# Patient Record
Sex: Female | Born: 1980 | Race: White | Hispanic: No | Marital: Married | Smoking: Current some day smoker
Health system: Southern US, Community
[De-identification: ages and names within clinical notes are randomized; demographics above are authoritative.]

## PROBLEM LIST (undated history)

## (undated) DIAGNOSIS — F419 Anxiety disorder, unspecified: Secondary | ICD-10-CM

## (undated) DIAGNOSIS — I10 Essential (primary) hypertension: Secondary | ICD-10-CM

## (undated) DIAGNOSIS — N83209 Unspecified ovarian cyst, unspecified side: Secondary | ICD-10-CM

## (undated) DIAGNOSIS — K219 Gastro-esophageal reflux disease without esophagitis: Secondary | ICD-10-CM

## (undated) DIAGNOSIS — IMO0001 Reserved for inherently not codable concepts without codable children: Secondary | ICD-10-CM

## (undated) DIAGNOSIS — R112 Nausea with vomiting, unspecified: Secondary | ICD-10-CM

## (undated) DIAGNOSIS — Z9889 Other specified postprocedural states: Secondary | ICD-10-CM

## (undated) DIAGNOSIS — D649 Anemia, unspecified: Secondary | ICD-10-CM

## (undated) DIAGNOSIS — R102 Pelvic and perineal pain: Secondary | ICD-10-CM

## (undated) DIAGNOSIS — R011 Cardiac murmur, unspecified: Secondary | ICD-10-CM

## (undated) HISTORY — PX: BLADDER SURGERY: SHX569

## (undated) HISTORY — DX: Unspecified ovarian cyst, unspecified side: N83.209

## (undated) HISTORY — PX: WISDOM TOOTH EXTRACTION: SHX21

## (undated) HISTORY — DX: Pelvic and perineal pain: R10.2

## (undated) HISTORY — PX: TUBAL LIGATION: SHX77

---
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
## 2014-04-19 ENCOUNTER — Emergency Department (HOSPITAL_COMMUNITY)
Admission: EM | Admit: 2014-04-19 | Discharge: 2014-04-19 | Disposition: A | Discharge status: Home / Self Care | Payer: Medicaid Other | Attending: Emergency Medicine | Admitting: Emergency Medicine

## 2014-04-19 ENCOUNTER — Emergency Department (HOSPITAL_COMMUNITY): Payer: Medicaid Other

## 2014-04-19 ENCOUNTER — Encounter (HOSPITAL_COMMUNITY): Payer: Self-pay | Admitting: Emergency Medicine

## 2014-04-19 DIAGNOSIS — I10 Essential (primary) hypertension: Secondary | ICD-10-CM | POA: Diagnosis present

## 2014-04-19 DIAGNOSIS — R110 Nausea: Secondary | ICD-10-CM | POA: Insufficient documentation

## 2014-04-19 DIAGNOSIS — Z720 Tobacco use: Secondary | ICD-10-CM | POA: Insufficient documentation

## 2014-04-19 DIAGNOSIS — N939 Abnormal uterine and vaginal bleeding, unspecified: Secondary | ICD-10-CM | POA: Diagnosis not present

## 2014-04-19 DIAGNOSIS — Z9889 Other specified postprocedural states: Secondary | ICD-10-CM | POA: Diagnosis not present

## 2014-04-19 DIAGNOSIS — F419 Anxiety disorder, unspecified: Secondary | ICD-10-CM | POA: Insufficient documentation

## 2014-04-19 DIAGNOSIS — K219 Gastro-esophageal reflux disease without esophagitis: Secondary | ICD-10-CM | POA: Diagnosis not present

## 2014-04-19 DIAGNOSIS — Z3202 Encounter for pregnancy test, result negative: Secondary | ICD-10-CM | POA: Diagnosis not present

## 2014-04-19 DIAGNOSIS — N8320 Unspecified ovarian cysts: Secondary | ICD-10-CM | POA: Diagnosis not present

## 2014-04-19 DIAGNOSIS — R030 Elevated blood-pressure reading, without diagnosis of hypertension: Secondary | ICD-10-CM

## 2014-04-19 DIAGNOSIS — Z79899 Other long term (current) drug therapy: Secondary | ICD-10-CM | POA: Diagnosis not present

## 2014-04-19 DIAGNOSIS — R1031 Right lower quadrant pain: Secondary | ICD-10-CM | POA: Diagnosis not present

## 2014-04-19 DIAGNOSIS — N83201 Unspecified ovarian cyst, right side: Secondary | ICD-10-CM

## 2014-04-19 DIAGNOSIS — R102 Pelvic and perineal pain: Secondary | ICD-10-CM

## 2014-04-19 DIAGNOSIS — IMO0001 Reserved for inherently not codable concepts without codable children: Secondary | ICD-10-CM

## 2014-04-19 HISTORY — DX: Gastro-esophageal reflux disease without esophagitis: K21.9

## 2014-04-19 HISTORY — DX: Essential (primary) hypertension: I10

## 2014-04-19 HISTORY — DX: Anxiety disorder, unspecified: F41.9

## 2014-04-19 HISTORY — DX: Reserved for inherently not codable concepts without codable children: IMO0001

## 2014-04-19 LAB — CBC
HCT: 33.8 % — ABNORMAL LOW (ref 36.0–46.0)
HEMOGLOBIN: 9.9 g/dL — AB (ref 12.0–15.0)
MCH: 19 pg — ABNORMAL LOW (ref 26.0–34.0)
MCHC: 29.3 g/dL — ABNORMAL LOW (ref 30.0–36.0)
MCV: 64.9 fL — ABNORMAL LOW (ref 78.0–100.0)
Platelets: 308 10*3/uL (ref 150–400)
RBC: 5.21 MIL/uL — AB (ref 3.87–5.11)
RDW: 19.7 % — ABNORMAL HIGH (ref 11.5–15.5)
WBC: 9.3 10*3/uL (ref 4.0–10.5)

## 2014-04-19 LAB — COMPREHENSIVE METABOLIC PANEL
ALT: 17 U/L (ref 0–35)
ANION GAP: 15 (ref 5–15)
AST: 24 U/L (ref 0–37)
Albumin: 3.6 g/dL (ref 3.5–5.2)
Alkaline Phosphatase: 83 U/L (ref 39–117)
BUN: 7 mg/dL (ref 6–23)
CHLORIDE: 100 meq/L (ref 96–112)
CO2: 20 mEq/L (ref 19–32)
Calcium: 8.6 mg/dL (ref 8.4–10.5)
Creatinine, Ser: 0.53 mg/dL (ref 0.50–1.10)
GFR calc non Af Amer: >90 mL/min (ref >90)
Glucose, Bld: 85 mg/dL (ref 70–99)
Potassium: 3.8 mEq/L (ref 3.7–5.3)
Sodium: 135 mEq/L — ABNORMAL LOW (ref 137–147)
Total Bilirubin: 0.3 mg/dL (ref 0.3–1.2)
Total Protein: 8 g/dL (ref 6.0–8.3)

## 2014-04-19 LAB — URINALYSIS, ROUTINE W REFLEX MICROSCOPIC
Bilirubin Urine: NEGATIVE
Glucose, UA: NEGATIVE mg/dL
Ketones, ur: NEGATIVE mg/dL
NITRITE: NEGATIVE
PH: 6 (ref 5.0–8.0)
Protein, ur: NEGATIVE mg/dL
SPECIFIC GRAVITY, URINE: 1.018 (ref 1.005–1.030)
Urobilinogen, UA: 1 mg/dL (ref 0.0–1.0)

## 2014-04-19 LAB — URINE MICROSCOPIC-ADD ON

## 2014-04-19 LAB — WET PREP, GENITAL
Clue Cells Wet Prep HPF POC: NONE SEEN
TRICH WET PREP: NONE SEEN
Yeast Wet Prep HPF POC: NONE SEEN

## 2014-04-19 LAB — POC URINE PREG, ED: PREG TEST UR: NEGATIVE

## 2014-04-19 MED ORDER — LABETALOL HCL 100 MG PO TABS: 100.0000 mg | ORAL_TABLET | Freq: Two times a day (BID) | ORAL | Status: DC

## 2014-04-19 MED ORDER — FERROUS SULFATE 325 (65 FE) MG PO TABS: 325.0000 mg | ORAL_TABLET | Freq: Every day | ORAL | Status: DC

## 2014-04-19 MED ORDER — HYDRALAZINE HCL 20 MG/ML IJ SOLN
10.0000 mg | Freq: Once | INTRAMUSCULAR | Status: AC
Start: 2014-04-19 — End: 2014-04-19
  Administered 2014-04-19: 10 mg via INTRAVENOUS
  Filled 2014-04-19: qty 1

## 2014-04-19 MED ORDER — HYDRALAZINE HCL 20 MG/ML IJ SOLN: 5.0000 mg | Freq: Once | INTRAMUSCULAR | Status: DC

## 2014-04-19 MED ORDER — HYDROCODONE-ACETAMINOPHEN 5-325 MG PO TABS: 1.0000 | ORAL_TABLET | ORAL | Status: DC | PRN

## 2014-04-19 NOTE — Discharge Instructions (Signed)
Take Vicodin for severe pain only. No driving or operating heavy machinery while taking vicodin. This medication may cause drowsiness.  Abnormal Uterine Bleeding Abnormal uterine bleeding can affect women at various stages in life, including teenagers, women in their reproductive years, pregnant women, and women who have reached menopause. Several kinds of uterine bleeding are considered abnormal, including:  Bleeding or spotting between periods.   Bleeding after sexual intercourse.   Bleeding that is heavier or more than normal.   Periods that last longer than usual.  Bleeding after menopause.  Many cases of abnormal uterine bleeding are minor and simple to treat, while others are more serious. Any type of abnormal bleeding should be evaluated by your health care provider. Treatment will depend on the cause of the bleeding. HOME CARE INSTRUCTIONS Monitor your condition for any changes. The following actions may help to alleviate any discomfort you are experiencing:  Avoid the use of tampons and douches as directed by your health care provider.  Change your pads frequently. You should get regular pelvic exams and Pap tests. Keep all follow-up appointments for diagnostic tests as directed by your health care provider.  SEEK MEDICAL CARE IF:   Your bleeding lasts more than 1 week.   You feel dizzy at times.  SEEK IMMEDIATE MEDICAL CARE IF:   You pass out.   You are changing pads every 15 to 30 minutes.   You have abdominal pain.  You have a fever.   You become sweaty or weak.   You are passing large blood clots from the vagina.   You start to feel nauseous and vomit. MAKE SURE YOU:   Understand these instructions.  Will watch your condition.  Will get help right away if you are not doing well or get worse. Document Released: 06/27/2005 Document Revised: 07/02/2013 Document Reviewed: 01/24/2013 [facility] Patient Information 2015 [place], [place]. This  information is not intended to replace advice given to you by your health care provider. Make sure you discuss any questions you have with your health care provider.  Ovarian Cyst An ovarian cyst is a fluid-filled sac that forms on an ovary. The ovaries are small organs that produce eggs in women. Various types of cysts can form on the ovaries. Most are not cancerous. Many do not cause problems, and they often go away on their own. Some may cause symptoms and require treatment. Common types of ovarian cysts include:  Functional cysts--These cysts may occur every month during the menstrual cycle. This is normal. The cysts usually go away with the next menstrual cycle if the woman does not get pregnant. Usually, there are no symptoms with a functional cyst.  Endometrioma cysts--These cysts form from the tissue that lines the uterus. They are also called "chocolate cysts" because they become filled with blood that turns brown. This type of cyst can cause pain in the lower abdomen during intercourse and with your menstrual period.  Cystadenoma cysts--This type develops from the cells on the outside of the ovary. These cysts can get very big and cause lower abdomen pain and pain with intercourse. This type of cyst can twist on itself, cut off its blood supply, and cause severe pain. It can also easily rupture and cause a lot of pain.  Dermoid cysts--This type of cyst is sometimes found in both ovaries. These cysts may contain different kinds of body tissue, such as skin, teeth, hair, or cartilage. They usually do not cause symptoms unless they get very big.  Theca lutein  cysts--These cysts occur when too much of a certain hormone (human chorionic gonadotropin) is produced and overstimulates the ovaries to produce an egg. This is most common after procedures used to assist with the conception of a baby (in vitro fertilization). CAUSES   Fertility drugs can cause a condition in which multiple large cysts  are formed on the ovaries. This is called ovarian hyperstimulation syndrome.  A condition called polycystic ovary syndrome can cause hormonal imbalances that can lead to nonfunctional ovarian cysts. SIGNS AND SYMPTOMS  Many ovarian cysts do not cause symptoms. If symptoms are present, they may include:  Pelvic pain or pressure.  Pain in the lower abdomen.  Pain during sexual intercourse.  Increasing girth (swelling) of the abdomen.  Abnormal menstrual periods.  Increasing pain with menstrual periods.  Stopping having menstrual periods without being pregnant. DIAGNOSIS  These cysts are commonly found during a routine or annual pelvic exam. Tests may be ordered to find out more about the cyst. These tests may include:  Ultrasound.  X-ray of the pelvis.  CT scan.  MRI.  Blood tests. TREATMENT  Many ovarian cysts go away on their own without treatment. Your health care provider may want to check your cyst regularly for 2-3 months to see if it changes. For women in menopause, it is particularly important to monitor a cyst closely because of the higher rate of ovarian cancer in menopausal women. When treatment is needed, it may include any of the following:  A procedure to drain the cyst (aspiration). This may be done using a long needle and ultrasound. It can also be done through a laparoscopic procedure. This involves using a thin, lighted tube with a tiny camera on the end (laparoscope) inserted through a small incision.  Surgery to remove the whole cyst. This may be done using laparoscopic surgery or an open surgery involving a larger incision in the lower abdomen.  Hormone treatment or birth control pills. These methods are sometimes used to help dissolve a cyst. HOME CARE INSTRUCTIONS   Only take over-the-counter or prescription medicines as directed by your health care provider.  Follow up with your health care provider as directed.  Get regular pelvic exams and Pap  tests. SEEK MEDICAL CARE IF:   Your periods are late, irregular, or painful, or they stop.  Your pelvic pain or abdominal pain does not go away.  Your abdomen becomes larger or swollen.  You have pressure on your bladder or trouble emptying your bladder completely.  You have pain during sexual intercourse.  You have feelings of fullness, pressure, or discomfort in your stomach.  You lose weight for no apparent reason.  You feel generally ill.  You become constipated.  You lose your appetite.  You develop acne.  You have an increase in body and facial hair.  You are gaining weight, without changing your exercise and eating habits.  You think you are pregnant. SEEK IMMEDIATE MEDICAL CARE IF:   You have increasing abdominal pain.  You feel sick to your stomach (nauseous), and you throw up (vomit).  You develop a fever that comes on suddenly.  You have abdominal pain during a bowel movement.  Your menstrual periods become heavier than usual. MAKE SURE YOU:  Understand these instructions.  Will watch your condition.  Will get help right away if you are not doing well or get worse. Document Released: 06/27/2005 Document Revised: 07/02/2013 Document Reviewed: 03/04/2013 [facility] Patient Information 2015 [place]Care, [place]. This information is not intended to  replace advice given to you by your health care provider. Make sure you discuss any questions you have with your health care provider. ° °

## 2014-04-19 NOTE — ED Provider Notes (Signed)
CSN: [id number]     Arrival date & time 04/19/14  1106 History   First MD Initiated Contact with Patient 04/19/14 1132     Chief Complaint  Patient presents with  . Hypertension     (Consider location/radiation/quality/duration/timing/severity/associated sxs/prior Treatment) HPI Comments: This is a 33 year-old female who presents to the ED with RLQ abdominal pain starting 6 hours ago. Pain described as a dull discomfort. No aggravating or alleviating factors. Patient reports missing her last period and intermittent vaginal spotting that began 2 weeks ago. LMP was February 23, 2014. She took a urine preg test 3 days ago that was a "faint postitive" but took one the next day that she reports was negative. She had a tubal ligation 8 years ago after having a cesarean section. She denies any other vaginal discharge. Admits to nausea without vomiting. She is in a monogamous relationship. She has a history of normal pap smears. She denies any blood in her stool, diarrhea or constipation. She also reports having HTN but being noncompliant with her Lisinopril because it makes her cough, and no longer has a prescription for labetalol. Denies CP, SOB, vision changes, HA.  She does not have  PCP at this time as she recently moved here.  Patient is a 33 y.o. female presenting with hypertension. The history is provided by the patient.  Hypertension Associated symptoms include abdominal pain and nausea.    Past Medical History  Diagnosis Date  . History of C-section   . Hypertension   . Anxiety   . Reflux    Past Surgical History  Procedure Laterality Date  . Bladder surgery     No family history on file. History  Substance Use Topics  . Smoking status: Current Every Day Smoker -- 1.00 packs/day  . Smokeless tobacco: Not on file  . Alcohol Use: No   OB History   Grav Para Term Preterm Abortions TAB SAB Ect Mult Living                 Review of Systems  Gastrointestinal: Positive for nausea  and abdominal pain.  Genitourinary: Positive for vaginal bleeding.  All other systems reviewed and are negative.     Allergies  Dimetapp decongestant  Home Medications   Prior to Admission medications   Medication Sig Start Date End Date Taking? Authorizing Provider  atorvastatin (LIPITOR) 20 MG tablet Take 20 mg by mouth daily.   Yes Historical Provider, MD  fenofibrate 160 MG tablet Take 160 mg by mouth daily.   Yes Historical Provider, MD  omeprazole (PRILOSEC OTC) 20 MG tablet Take 20 mg by mouth daily.   Yes Historical Provider, MD  ranitidine (ZANTAC) 300 MG tablet Take 300 mg by mouth at bedtime.   Yes Historical Provider, MD  sertraline (ZOLOFT) 100 MG tablet Take 150 mg by mouth daily.   Yes Historical Provider, MD  ferrous sulfate 325 (65 FE) MG tablet Take 1 tablet (325 mg total) by mouth daily. 04/19/14   Kathrynn Speedobyn M Senetra Dillin, PA-C  HYDROcodone-acetaminophen (NORCO/VICODIN) 5-325 MG per tablet Take 1-2 tablets by mouth every 4 (four) hours as needed. 04/19/14   Kathrynn Speedobyn M Julious Langlois, PA-C  labetalol (NORMODYNE) 100 MG tablet Take 1 tablet (100 mg total) by mouth 2 (two) times daily. 04/19/14   Kathrynn Speedobyn M Jasilyn Holderman, PA-C  lisinopril-hydrochlorothiazide (PRINZIDE,ZESTORETIC) 20-12.5 MG per tablet Take 1 tablet by mouth daily.    Historical Provider, MD   BP 185/92  Pulse 85  Temp(Src) 98.2 F (36.8  C) (Oral)  Resp 21  SpO2 98%  LMP 02/23/2014 Physical Exam  Nursing note and vitals reviewed. Constitutional: She is oriented to person, place, and time. She appears well-developed and well-nourished. No distress.  HENT:  Head: Normocephalic and atraumatic.  Mouth/Throat: Oropharynx is clear and moist.  Eyes: Conjunctivae are normal.  Neck: Normal range of motion. Neck supple.  Cardiovascular: Normal rate, regular rhythm and normal heart sounds.   Pulmonary/Chest: Effort normal and breath sounds normal.  Abdominal: Soft. Bowel sounds are normal.  Mild discomfort of RLQ with palpation. No  rigidity, rebound or guarding. No peritoneal signs.  Genitourinary: Uterus normal. Cervix exhibits no motion tenderness, no discharge and no friability. Right adnexum displays no mass, no tenderness and no fullness. Left adnexum displays no mass, no tenderness and no fullness. No signs of injury around the vagina.  Scant vaginal discharge and blood. Foul smell.  Musculoskeletal: Normal range of motion. She exhibits no edema.  Neurological: She is alert and oriented to person, place, and time.  Skin: Skin is warm and dry. She is not diaphoretic.  Psychiatric: She has a normal mood and affect. Her behavior is normal.    ED Course  Procedures (including critical care time) Labs Review Labs Reviewed  WET PREP, GENITAL - Abnormal; Notable for the following:    WBC, Wet Prep HPF POC FEW (*)    All other components within normal limits  CBC - Abnormal; Notable for the following:    RBC 5.21 (*)    Hemoglobin 9.9 (*)    HCT 33.8 (*)    MCV 64.9 (*)    MCH 19.0 (*)    MCHC 29.3 (*)    RDW 19.7 (*)    All other components within normal limits  URINALYSIS, ROUTINE W REFLEX MICROSCOPIC - Abnormal; Notable for the following:    APPearance CLOUDY (*)    Hgb urine dipstick LARGE (*)    Leukocytes, UA MODERATE (*)    All other components within normal limits  COMPREHENSIVE METABOLIC PANEL - Abnormal; Notable for the following:    Sodium 135 (*)    All other components within normal limits  GC/CHLAMYDIA PROBE AMP  URINE MICROSCOPIC-ADD ON  POC URINE PREG, ED    Imaging Review [place] Transvaginal Non-ob  04/19/2014   CLINICAL DATA:  Right-sided pelvic pain. Vaginal spotting for 2 weeks. LMP 02/23/2014.  EXAM: TRANSABDOMINAL AND TRANSVAGINAL ULTRASOUND OF PELVIS  TECHNIQUE: Both transabdominal and transvaginal ultrasound examinations of the pelvis were performed. Transabdominal technique was performed for global imaging of the pelvis including uterus, ovaries, adnexal regions, and pelvic  cul-de-sac. It was necessary to proceed with endovaginal exam following the transabdominal exam to visualize the ovaries and cystic adnexal lesion.  COMPARISON:  None  FINDINGS: Uterus  Measurements: 9.6 x 5.5 x 5.9 cm. No fibroids or other mass visualized.  Endometrium  Thickness: 14 mm.  No focal abnormality visualized.  Right ovary  Measurements: 2.8 x 1.3 x 3.0 cm. A bilobed cystic structure is seen which is contiguous with the right ovary. This measures approximately 3.2 x 6.9 cm and has benign sonographic features. Differential diagnosis includes right ovarian cyst, paraovarian cyst, and hydrosalpinx.  Left ovary  Measurements: 3.5 x 2.5 x 2.4 cm. A small left ovarian corpus luteum is noted. There is also a small benign appearing unilocular cyst seen which is contiguous with the left ovary measuring 1.8 cm in maximum diameter. This could represent a benign paraovarian cyst or hydrosalpinx.  Other findings  No free fluid.  IMPRESSION: 6.9 cm benign appearing cystic lesion in right adnexa, with differential diagnosis including a right ovarian cyst, paraovarian cyst, and hydrosalpinx. Consider pelvic MRI without and with contrast for further characterization, or at minimum, yearly followup by ultrasound to assess stability. This recommendation follows the consensus statement: Management of Asymptomatic Ovarian and Other Adnexal Cysts Imaged at [place]: Society of Radiologists in Ultrasound Consensus Conference Statement. Radiology 2010; [id number].  1.8 cm benign appearing paraovarian cyst or hydrosalpinx in the left adnexa.   Electronically Signed   By: Myles Rosenthal M.D.   On: 04/19/2014 14:42   US Pelvis Complete  04/19/2014   CLINICAL DATA:  Right-sided pelvic pain. Vaginal spotting for 2 weeks. LMP 02/23/2014.  EXAM: TRANSABDOMINAL AND TRANSVAGINAL ULTRASOUND OF PELVIS  TECHNIQUE: Both transabdominal and transvaginal ultrasound examinations of the pelvis were performed. Transabdominal technique was performed  for global imaging of the pelvis including uterus, ovaries, adnexal regions, and pelvic cul-de-sac. It was necessary to proceed with endovaginal exam following the transabdominal exam to visualize the ovaries and cystic adnexal lesion.  COMPARISON:  None  FINDINGS: Uterus  Measurements: 9.6 x 5.5 x 5.9 cm. No fibroids or other mass visualized.  Endometrium  Thickness: 14 mm.  No focal abnormality visualized.  Right ovary  Measurements: 2.8 x 1.3 x 3.0 cm. A bilobed cystic structure is seen which is contiguous with the right ovary. This measures approximately 3.2 x 6.9 cm and has benign sonographic features. Differential diagnosis includes right ovarian cyst, paraovarian cyst, and hydrosalpinx.  Left ovary  Measurements: 3.5 x 2.5 x 2.4 cm. A small left ovarian corpus luteum is noted. There is also a small benign appearing unilocular cyst seen which is contiguous with the left ovary measuring 1.8 cm in maximum diameter. This could represent a benign paraovarian cyst or hydrosalpinx.  Other findings  No free fluid.  IMPRESSION: 6.9 cm benign appearing cystic lesion in right adnexa, with differential diagnosis including a right ovarian cyst, paraovarian cyst, and hydrosalpinx. Consider pelvic MRI without and with contrast for further characterization, or at minimum, yearly followup by ultrasound to assess stability. This recommendation follows the consensus statement: Management of Asymptomatic Ovarian and Other Adnexal Cysts Imaged at [place]: Society of Radiologists in Ultrasound Consensus Conference Statement. Radiology 2010; [phone].  1.8 cm benign appearing paraovarian cyst or hydrosalpinx in the left adnexa.   Electronically Signed   By: Myles Rosenthal M.D.   On: 04/19/2014 14:42     EKG Interpretation   Date/Time:  Saturday April 19 2014 12:43:44 EDT Ventricular Rate:  78 PR Interval:  173 QRS Duration: 90 QT Interval:  418 QTC Calculation: 476 R Axis:   46 Text Interpretation:  Sinus rhythm  Nonspecific T abnormalities, inferior  leads Borderline prolonged QT interval No old tracing to compare Confirmed  by KNAPP  MD-I, IVA ([id number]) on 04/19/2014 12:54:33 PM      MDM   Final diagnoses:  Right ovarian cyst  Vaginal bleeding  Elevated blood pressure   Patient with vaginal bleeding and right-sided abdominal pain. She is nontoxic-appearing and in no apparent distress. Afebrile, hypertensive, vitals otherwise stable. Abdominal tenderness is a discomfort, no peritoneal signs. Doubt ectopic pregnancy. Urine pregnancy negative. Given patient's tenderness is only discomfort, doubt ovarian torsion. Labs showing anemia with hemoglobin of 9.9, patient reports she has a history of iron deficiency anemia in the past. Wet prep with few white blood cells, no other findings. Doubt PID, no cervical motion tenderness or cervical discharge. No  UTI. Given vaginal bleeding with right-sided pelvic pain, pelvic ultrasound obtained, 6.9 cm benign-appearing cystic lesion in the right adnexa present. Discussed these findings with patient, resources given for gynecology followup as she does not have one in this area since she recently moved here. Regarding blood pressure, hydralazine given in the emergency department with significant reduction of blood pressure to 185/92. Asymptomatic, no chest pain, shortness of breath, headache or vision change. She reports she was taking lisinopril in the past, however this makes her cough. She also was on labetalol and had no problems. She does not have a doctor to prescribe this for her at this time. Resources for PCP followup given. Patient will be discharged home with iron supplementation, labetalol and Vicodin. Stable for discharge. Return precautions given. Patient states understanding of treatment care plan and is agreeable.  Kathrynn Speed, PA-C 04/19/14 [phone]

## 2014-04-19 NOTE — ED Provider Notes (Signed)
Medical screening examination/treatment/procedure(s) were performed by non-physician practitioner and as supervising physician I was immediately available for consultation/collaboration.   EKG Interpretation   Date/Time:  Saturday April 19 2014 12:43:44 EDT Ventricular Rate:  78 PR Interval:  173 QRS Duration: 90 QT Interval:  418 QTC Calculation: 476 R Axis:   46 Text Interpretation:  Sinus rhythm Nonspecific T abnormalities, inferior  leads Borderline prolonged QT interval No old tracing to compare Confirmed  by Zahra Peffley  MD-I, Asier Desroches ([id number]) on 04/19/2014 12:54:33 PM      Devoria AlbeIva Francois Elk, MD, Franz DellFACEP   Chela Sutphen L Duyen Beckom, MD 04/19/14 [phone]

## 2014-04-19 NOTE — ED Notes (Signed)
Patient transported to Ultrasound 

## 2014-04-19 NOTE — ED Notes (Signed)
Patient with BP of 230/108.  Reports she used to take lisinopril but it made her cough so she stopped taking them.  Also she has missed her period this month, took a pregnancy test which was positive, then another one which is negative.  Patient had her tubes tied or burned? 8 years ago when she had her son.

## 2014-04-21 LAB — GC/CHLAMYDIA PROBE AMP
CT Probe RNA: NEGATIVE
GC PROBE AMP APTIMA: NEGATIVE

## 2014-06-13 ENCOUNTER — Ambulatory Visit (INDEPENDENT_AMBULATORY_CARE_PROVIDER_SITE_OTHER): Payer: Medicaid Other | Admitting: Obstetrics & Gynecology

## 2014-06-13 ENCOUNTER — Encounter: Payer: Self-pay | Admitting: Obstetrics & Gynecology

## 2014-06-13 VITALS — BP 157/74 | HR 78 | Temp 98.5°F | Ht 62.75 in | Wt 220.5 lb

## 2014-06-13 DIAGNOSIS — N8320 Unspecified ovarian cysts: Secondary | ICD-10-CM

## 2014-06-13 DIAGNOSIS — N83209 Unspecified ovarian cyst, unspecified side: Secondary | ICD-10-CM

## 2014-06-13 NOTE — Progress Notes (Signed)
Subjective:     Patient ID: Shelley Ford, female   DOB: 1981/03/21, 33 y.o.   MRN: [id number]  HPI [facility] and dx'd with an ovarian cyst.  She reports that she is completely asymptomatic at present.  She reports one heavy cycle 4 days after her visit there which resolved and did not return with her next cycle.  She reports that she had a normal PAP 1 year ago prior to moving to [place].  Review of Systems     Objective:   Physical Exam BP 157/74 mmHg  Pulse 78  Temp(Src) 98.5 F (36.9 C)  Ht 5' 2.75" (1.594 m)  Wt 220 lb 8 oz (100.018 kg)  BMI 39.36 kg/m2  LMP 06/09/2014 Pt in NAD Abd: obese, NT, ND GU: EGBUS: no lesions Vagina:  blood in vault Cervix: no CMT Uterus: small, mobile Adnexa: no masses; non tender        04/19/2014 CLINICAL DATA: Right-sided pelvic pain. Vaginal spotting for 2 weeks. LMP 02/23/2014.  EXAM: TRANSABDOMINAL AND TRANSVAGINAL ULTRASOUND OF PELVIS  TECHNIQUE: Both transabdominal and transvaginal ultrasound examinations of the pelvis were performed. Transabdominal technique was performed for global imaging of the pelvis including uterus, ovaries, adnexal regions, and pelvic cul-de-sac. It was necessary to proceed with endovaginal exam following the transabdominal exam to visualize the ovaries and cystic adnexal lesion.  COMPARISON: None  FINDINGS: Uterus  Measurements: 9.6 x 5.5 x 5.9 cm. No fibroids or other mass visualized.  Endometrium  Thickness: 14 mm. No focal abnormality visualized.  Right ovary  Measurements: 2.8 x 1.3 x 3.0 cm. A bilobed cystic structure is seen which is contiguous with the right ovary. This measures approximately 3.2 x 6.9 cm and has benign sonographic features. Differential diagnosis includes right ovarian cyst, paraovarian cyst, and hydrosalpinx.  Left ovary  Measurements: 3.5 x 2.5 x 2.4 cm. A small left ovarian corpus luteum is noted. There is also a small benign  appearing unilocular cyst seen which is contiguous with the left ovary measuring 1.8 cm in maximum diameter. This could represent a benign paraovarian cyst or hydrosalpinx.  Other findings  No free fluid.  IMPRESSION: 6.9 cm benign appearing cystic lesion in right adnexa, with differential diagnosis including a right ovarian cyst, paraovarian cyst, and hydrosalpinx. Consider pelvic MRI without and with contrast for further characterization, or at minimum, yearly followup by ultrasound to assess stability. This recommendation follows the consensus statement: Management of Asymptomatic Ovarian and Other Adnexal Cysts Imaged at [place]: Society of Radiologists in Ultrasound Consensus Conference Statement. Radiology 2010; [phone].  1.8 cm benign appearing paraovarian cyst or hydrosalpinx in the left adnexa.  Assessment:     Ov cyst.  Sx all relieved     Plan:     F/u sono in 1 year Annual with PAP in 1 year or sooner prn

## 2014-06-13 NOTE — Patient Instructions (Signed)
Ovarian Cyst An ovarian cyst is a fluid-filled sac that forms on an ovary. The ovaries are small organs that produce eggs in women. Various types of cysts can form on the ovaries. Most are not cancerous. Many do not cause problems, and they often go away on their own. Some may cause symptoms and require treatment. Common types of ovarian cysts include:  Functional cysts--These cysts may occur every month during the menstrual cycle. This is normal. The cysts usually go away with the next menstrual cycle if the woman does not get pregnant. Usually, there are no symptoms with a functional cyst.  Endometrioma cysts--These cysts form from the tissue that lines the uterus. They are also called "chocolate cysts" because they become filled with blood that turns brown. This type of cyst can cause pain in the lower abdomen during intercourse and with your menstrual period.  Cystadenoma cysts--This type develops from the cells on the outside of the ovary. These cysts can get very big and cause lower abdomen pain and pain with intercourse. This type of cyst can twist on itself, cut off its blood supply, and cause severe pain. It can also easily rupture and cause a lot of pain.  Dermoid cysts--This type of cyst is sometimes found in both ovaries. These cysts may contain different kinds of body tissue, such as skin, teeth, hair, or cartilage. They usually do not cause symptoms unless they get very big.  Theca lutein cysts--These cysts occur when too much of a certain hormone (human chorionic gonadotropin) is produced and overstimulates the ovaries to produce an egg. This is most common after procedures used to assist with the conception of a baby (in vitro fertilization). CAUSES   Fertility drugs can cause a condition in which multiple large cysts are formed on the ovaries. This is called ovarian hyperstimulation syndrome.  A condition called polycystic ovary syndrome can cause hormonal imbalances that can lead to  nonfunctional ovarian cysts. SIGNS AND SYMPTOMS  Many ovarian cysts do not cause symptoms. If symptoms are present, they may include:  Pelvic pain or pressure.  Pain in the lower abdomen.  Pain during sexual intercourse.  Increasing girth (swelling) of the abdomen.  Abnormal menstrual periods.  Increasing pain with menstrual periods.  Stopping having menstrual periods without being pregnant. DIAGNOSIS  These cysts are commonly found during a routine or annual pelvic exam. Tests may be ordered to find out more about the cyst. These tests may include:  Ultrasound.  X-ray of the pelvis.  CT scan.  MRI.  Blood tests. TREATMENT  Many ovarian cysts go away on their own without treatment. Your health care provider may want to check your cyst regularly for 2-3 months to see if it changes. For women in menopause, it is particularly important to monitor a cyst closely because of the higher rate of ovarian cancer in menopausal women. When treatment is needed, it may include any of the following:  A procedure to drain the cyst (aspiration). This may be done using a long needle and ultrasound. It can also be done through a laparoscopic procedure. This involves using a thin, lighted tube with a tiny camera on the end (laparoscope) inserted through a small incision.  Surgery to remove the whole cyst. This may be done using laparoscopic surgery or an open surgery involving a larger incision in the lower abdomen.  Hormone treatment or birth control pills. These methods are sometimes used to help dissolve a cyst. HOME CARE INSTRUCTIONS   Only take over-the-counter   or prescription medicines as directed by your health care provider.  Follow up with your health care provider as directed.  Get regular pelvic exams and Pap tests. SEEK MEDICAL CARE IF:   Your periods are late, irregular, or painful, or they stop.  Your pelvic pain or abdominal pain does not go away.  Your abdomen becomes  larger or swollen.  You have pressure on your bladder or trouble emptying your bladder completely.  You have pain during sexual intercourse.  You have feelings of fullness, pressure, or discomfort in your stomach.  You lose weight for no apparent reason.  You feel generally ill.  You become constipated.  You lose your appetite.  You develop acne.  You have an increase in body and facial hair.  You are gaining weight, without changing your exercise and eating habits.  You think you are pregnant. SEEK IMMEDIATE MEDICAL CARE IF:   You have increasing abdominal pain.  You feel sick to your stomach (nauseous), and you throw up (vomit).  You develop a fever that comes on suddenly.  You have abdominal pain during a bowel movement.  Your menstrual periods become heavier than usual. MAKE SURE YOU:  Understand these instructions.  Will watch your condition.  Will get help right away if you are not doing well or get worse. Document Released: 06/27/2005 Document Revised: 07/02/2013 Document Reviewed: 03/04/2013 [employer] Patient Information 2015 [employer]. This information is not intended to replace advice given to you by your health care provider. Make sure you discuss any questions you have with your health care provider.  

## 2016-07-21 ENCOUNTER — Encounter: Payer: Self-pay | Admitting: Obstetrics & Gynecology

## 2016-07-21 ENCOUNTER — Ambulatory Visit (INDEPENDENT_AMBULATORY_CARE_PROVIDER_SITE_OTHER): Payer: Medicaid Other | Admitting: Obstetrics & Gynecology

## 2016-07-21 VITALS — BP 136/43 | HR 75 | Ht 62.0 in | Wt 222.8 lb

## 2016-07-21 DIAGNOSIS — N939 Abnormal uterine and vaginal bleeding, unspecified: Secondary | ICD-10-CM

## 2016-07-21 DIAGNOSIS — D500 Iron deficiency anemia secondary to blood loss (chronic): Secondary | ICD-10-CM | POA: Diagnosis not present

## 2016-07-21 LAB — TSH: TSH: 1.03 mIU/L

## 2016-07-21 MED ORDER — MEGESTROL ACETATE 40 MG PO TABS: 40.0000 mg | ORAL_TABLET | Freq: Two times a day (BID) | ORAL | 1 refills | Status: DC

## 2016-07-21 NOTE — Patient Instructions (Signed)
Abnormal Uterine Bleeding Abnormal uterine bleeding can affect women at various stages in life, including teenagers, women in their reproductive years, pregnant women, and women who have reached menopause. Several kinds of uterine bleeding are considered abnormal, including:  Bleeding or spotting between periods.  Bleeding after sexual intercourse.  Bleeding that is heavier or more than normal.  Periods that last longer than usual.  Bleeding after menopause. Many cases of abnormal uterine bleeding are minor and simple to treat, while others are more serious. Any type of abnormal bleeding should be evaluated by your health care provider. Treatment will depend on the cause of the bleeding. Follow these instructions at home: Monitor your condition for any changes. The following actions may help to alleviate any discomfort you are experiencing:  Avoid the use of tampons and douches as directed by your health care provider.  Change your pads frequently. You should get regular pelvic exams and Pap tests. Keep all follow-up appointments for diagnostic tests as directed by your health care provider. Contact a health care provider if:  Your bleeding lasts more than 1 week.  You feel dizzy at times. Get help right away if:  You pass out.  You are changing pads every 15 to 30 minutes.  You have abdominal pain.  You have a fever.  You become sweaty or weak.  You are passing large blood clots from the vagina.  You start to feel nauseous and vomit. This information is not intended to replace advice given to you by your health care provider. Make sure you discuss any questions you have with your health care provider. Document Released: 06/27/2005 Document Revised: 12/09/2015 Document Reviewed: 01/24/2013 Elsevier Interactive Patient Education  2017 [employer]  

## 2016-07-21 NOTE — Progress Notes (Addendum)
History:  36 y.o. [id number] here today for AUB. Pt reports montly cycles  For 4-6 days until 3 months prev. In the past 3 moths pt reports almost continuous bleeding.  Pt repots bleeding today.  Pt denies SOB or DOE. Pt reports a h/o anemia.  Pt had thyroid checked about 5 years prev.   Pt had a cyst on her ovary in 2015. She did not have f/u because she did not feel it was necessary. Last PAP .1 year prev. Not sure where last PAP was done.  The following portions of the patient's history were reviewed and updated as appropriate: allergies, current medications, past family history, past medical history, past social history, past surgical history and problem list.  Review of Systems:  Pertinent items are noted in HPI.   Objective:  Physical Exam Blood pressure (!) 136/43, pulse 75, height 5\' 2"  (1.575 m), weight 222 lb 12.8 oz (101.1 kg). Gen: NAD; pt is VERY pale.  (per daughter and pt this is normal HEENT: pale conjunctiva Lungs: CTA CV: RRR Abd: Soft, nontender and nondistended Pelvic: Normal appearing external genitalia; normal appearing vaginal mucosa and cervix- blood with clots coming from the os. Blood in vault. Enlarged uterus- exact size indeterminate- limited by pts body habitus; no other palpable masses or adnexal tenderness appreciated. Ext: pink nail beds   Labs and Imaging  04/19/2014 EXAM: TRANSABDOMINAL AND TRANSVAGINAL ULTRASOUND OF PELVIS  TECHNIQUE: Both transabdominal and transvaginal ultrasound examinations of the pelvis were performed. Transabdominal technique was performed for global imaging of the pelvis including uterus, ovaries, adnexal regions, and pelvic cul-de-sac. It was necessary to proceed with endovaginal exam following the transabdominal exam to visualize the ovaries and cystic adnexal lesion.  COMPARISON:  None  FINDINGS: Uterus  Measurements: 9.6 x 5.5 x 5.9 cm. No fibroids or other mass visualized.  Endometrium  Thickness: 14 mm.   No focal abnormality visualized.  Right ovary  Measurements: 2.8 x 1.3 x 3.0 cm. A bilobed cystic structure is seen which is contiguous with the right ovary. This measures approximately 3.2 x 6.9 cm and has benign sonographic features. Differential diagnosis includes right ovarian cyst, paraovarian cyst, and hydrosalpinx.  Left ovary  Measurements: 3.5 x 2.5 x 2.4 cm. A small left ovarian corpus luteum is noted. There is also a small benign appearing unilocular cyst seen which is contiguous with the left ovary measuring 1.8 cm in maximum diameter. This could represent a benign paraovarian cyst or hydrosalpinx.  Other findings  No free fluid.  IMPRESSION: 6.9 cm benign appearing cystic lesion in right adnexa, with differential diagnosis including a right ovarian cyst, paraovarian cyst, and hydrosalpinx. Consider pelvic MRI without and with contrast for further characterization, or at minimum, yearly followup by ultrasound to assess stability. This recommendation follows the consensus statement: Management of Asymptomatic Ovarian and Other Adnexal Cysts Imaged at US: Society of Radiologists in Ultrasound Consensus Conference Statement. Radiology 2010; [phone] [id number].  1.8 cm benign appearing paraovarian cyst or hydrosalpinx in the left adnexa  Assessment & Plan:  AUB- actively bleeding H/o anemia due to chronic blood loss- need to recheck CBC Heart murmer Right OV cyst   Megace 40mg  bid Pelvic US F/u in 2 weeks or sooner prn Labs: TSH and CBC  Shakisha Abend L. Harraway-Smith, M.D., [facility] CoreFACOG

## 2016-07-22 ENCOUNTER — Telehealth (HOSPITAL_COMMUNITY): Payer: Self-pay | Admitting: *Deleted

## 2016-07-22 ENCOUNTER — Telehealth: Payer: Self-pay | Admitting: *Deleted

## 2016-07-22 ENCOUNTER — Encounter: Payer: Self-pay | Admitting: *Deleted

## 2016-07-22 ENCOUNTER — Encounter: Payer: Self-pay | Admitting: Obstetrics & Gynecology

## 2016-07-22 DIAGNOSIS — R102 Pelvic and perineal pain: Secondary | ICD-10-CM

## 2016-07-22 LAB — CBC
HCT: 18.6 % — ABNORMAL LOW (ref 35.0–45.0)
Hemoglobin: 5.2 g/dL — CL (ref 11.7–15.5)
MCH: 17.3 pg — ABNORMAL LOW (ref 27.0–33.0)
MCHC: 28 g/dL — ABNORMAL LOW (ref 32.0–36.0)
MCV: 61.8 fL — ABNORMAL LOW (ref 80.0–100.0)
MPV: 9.2 fL (ref 7.5–12.5)
PLATELETS: 345 10*3/uL (ref 140–400)
RBC: 3.01 MIL/uL — AB (ref 3.80–5.10)
RDW: 19.5 % — AB (ref 11.0–15.0)
WBC: 12.1 10*3/uL — ABNORMAL HIGH (ref 3.8–10.8)

## 2016-07-22 NOTE — Progress Notes (Signed)
Critical lab called from [facility], patient has hgb 5.2. Have left message with Dr Erin FullingHarraway-Smith and will send inbox message as well.

## 2016-07-22 NOTE — Telephone Encounter (Signed)
Patient has hgb 5.2, Dr Erin FullingHarraway Smith has been informed. She asked me to call patient and have her come to [facility]U. Attempted all listed numbers and was unable to get anyone or leave a message. Will try again later.  1200--Attempted to call again, received no answer but this time was able to leave a vm on patient's phone. Message stated I am calling with results, but unfortunately we are closed at this time. Per Dr Erin FullingHarraway-Smith, pt needs to report to the MAU asap. Will notify MAU to attempt to contact patient as well.  1208--Called MAU and gave pt info and contact numbers so they can try to reach her.

## 2016-07-26 ENCOUNTER — Telehealth: Payer: Self-pay | Admitting: General Practice

## 2016-07-26 NOTE — Telephone Encounter (Signed)
Per Dr Erin FullingHarraway Smith, patient needs to go to MAU for evaluation due to hgb levels with symptomatic anemia. Called patient & left message to call us back regarding results. Called both contact numbers, no answer & no voicemail option. Called ultrasound and informed of patient status and to send patient to MAU following ultrasound Thursday 1/18. Will send certified letter

## 2016-07-28 ENCOUNTER — Telehealth: Payer: Self-pay | Admitting: Medical

## 2016-07-28 ENCOUNTER — Ambulatory Visit (HOSPITAL_COMMUNITY)
Admission: RE | Admit: 2016-07-28 | Discharge: 2016-07-28 | Disposition: A | Discharge status: Home / Self Care | Payer: Medicaid Other | Source: Ambulatory Visit | Attending: Obstetrics & Gynecology | Admitting: Obstetrics & Gynecology

## 2016-07-28 ENCOUNTER — Inpatient Hospital Stay (HOSPITAL_COMMUNITY)
Admission: AD | Admit: 2016-07-28 | Discharge: 2016-07-28 | Discharge status: Absent without leave | Payer: Medicaid Other | Attending: Obstetrics & Gynecology | Admitting: Obstetrics & Gynecology

## 2016-07-28 DIAGNOSIS — N854 Malposition of uterus: Secondary | ICD-10-CM | POA: Insufficient documentation

## 2016-07-28 DIAGNOSIS — N939 Abnormal uterine and vaginal bleeding, unspecified: Secondary | ICD-10-CM | POA: Diagnosis not present

## 2016-07-28 DIAGNOSIS — D649 Anemia, unspecified: Secondary | ICD-10-CM | POA: Insufficient documentation

## 2016-07-28 NOTE — Telephone Encounter (Signed)
Patient sent to [facility] from [facility] for direct admit. Numerous [facility]U and [facility] staff had attempted to contact the patient about the need for admission for blood transfusion last week, but were never able to reach the patient. I discussed the plan for admission for blood transfusion today. Patient states that she is unable to stay today for admission, but will return on Friday evening or Sunday morning at the latest. She also states that she has stopped bleeding since her last appointment and is feeling somewhat better. Discussed patient with Dr. Debroah LoopArnold, he agrees with plan. Admissions and [facility] unit RN advised of admission this weekend. Discussed warning signs for worsening condition and reasons to return ASAP. Patient voiced understanding.   Shelley LowensteinJulie N Noami Bove, PA-C 07/28/2016 1:13 PM

## 2016-08-04 ENCOUNTER — Encounter: Payer: Self-pay | Admitting: Obstetrics & Gynecology

## 2016-08-04 ENCOUNTER — Ambulatory Visit (INDEPENDENT_AMBULATORY_CARE_PROVIDER_SITE_OTHER): Payer: Medicaid Other | Admitting: Obstetrics & Gynecology

## 2016-08-04 DIAGNOSIS — N939 Abnormal uterine and vaginal bleeding, unspecified: Secondary | ICD-10-CM | POA: Insufficient documentation

## 2016-08-04 DIAGNOSIS — D500 Iron deficiency anemia secondary to blood loss (chronic): Secondary | ICD-10-CM | POA: Insufficient documentation

## 2016-08-04 MED ORDER — INTEGRA F 125-1 MG PO CAPS: 1.0000 | ORAL_CAPSULE | Freq: Every day | ORAL | 3 refills | Status: DC

## 2016-08-04 NOTE — Patient Instructions (Signed)
Endometrial Ablation Endometrial ablation removes the lining of the uterus (endometrium). It is usually a same-day, outpatient treatment. Ablation helps avoid major surgery, such as surgery to remove the cervix and uterus (hysterectomy). After endometrial ablation, you will have little or no menstrual bleeding and may not be able to have children. However, if you are premenopausal, you will need to use a reliable method of birth control following the procedure because of the small chance that pregnancy can occur. There are different reasons to have this procedure. These reasons include:  Heavy periods.  Bleeding that is causing anemia.  Irregular bleeding.  Bleeding fibroids on the lining inside the uterus if they are smaller than 3 centimeters. This procedure may not be possible for you if:   You want to have children in the future.   You have severe cramps with your menstrual period.   You have precancerous or cancerous cells in your uterus.   You were recently pregnant.   You have gone through menopause.   You have had major surgery on your uterus, resulting in thinning of the uterine wall. Surgeries may include:  The removal of one or more uterine fibroids (myomectomy).  A cesarean section with a classic (vertical) incision on your uterus. Ask your health care provider what type of cesarean you had. Sometimes the scar on your skin is different than the scar on your uterus. Even if you have had surgery on your uterus, certain types of ablation may still be safe for you. Talk with your health care provider. LET YOUR HEALTH CARE PROVIDER KNOW ABOUT:  Any allergies you have.  All medicines you are taking, including vitamins, herbs, eye drops, creams, and over-the-counter medicines.  Previous problems you or members of your family have had with the use of anesthetics.  Any blood disorders you have.  Previous surgeries you have had.  Medical conditions you have. RISKS AND  COMPLICATIONS  Generally, this is a safe procedure. However, as with any procedure, complications can occur. Possible complications include:  Perforation of the uterus.  Bleeding.  Infection of the uterus, bladder, or vagina.  Injury to surrounding organs.  An air bubble to the lung (air embolus).  Pregnancy following the procedure.  Failure of the procedure to help the problem, requiring hysterectomy.  Decreased ability to diagnose cancer in the lining of the uterus. BEFORE THE PROCEDURE  The lining of the uterus must be tested to make sure there is no pre-cancerous or cancer cells present.  An ultrasound may be performed to look at the size of the uterus and to check for abnormalities.  Medicines may be given to thin the lining of the uterus. PROCEDURE  During the procedure, your health care provider will use a tool called a resectoscope to help see inside your uterus. There are different ways to remove the lining of your uterus.   Radiofrequency - This method uses a radiofrequency-alternating electric current to remove the lining of the uterus.  Cryotherapy - This method uses extreme cold to freeze the lining of the uterus.  Heated-Free Liquid - This method uses heated salt (saline) solution to remove the lining of the uterus.  Microwave - This method uses high-energy microwaves to heat up the lining of the uterus to remove it.  Thermal balloon - This method involves inserting a catheter with a balloon tip into the uterus. The balloon tip is filled with heated fluid to remove the lining of the uterus. AFTER THE PROCEDURE  After your procedure, do   not have sexual intercourse or insert anything into your vagina until permitted by your health care provider. After the procedure, you may experience:  Cramps.  Vaginal discharge.  Frequent urination. This information is not intended to replace advice given to you by your health care provider. Make sure you discuss any  questions you have with your health care provider. Document Released: 05/06/2004 Document Revised: 03/18/2015 Document Reviewed: 11/28/2012 Elsevier Interactive Patient Education  2017 [employer]  

## 2016-08-04 NOTE — Progress Notes (Signed)
History:  36 y.o. [id number] here today for f/u of AUB and severe anemia.  Several attempts were made to contact pt via telephone about her anemia to get her ot come to the hosp with no success. Pt reports that her bleeding stopped the day after she started the Megace.  She reports that she feels 'tired here and there' but she does not feel like she did last week.  She denies dizziness. She denies SOB.    The following portions of the patient's history were reviewed and updated as appropriate: allergies, current medications, past family history, past medical history, past social history, past surgical history and problem list.  Review of Systems:  Pertinent items are noted in HPI.   Objective:  Physical Exam Blood pressure (!) 158/64, pulse 84, weight 225 lb (102.1 kg). BP (!) 158/64   Pulse 84   Wt 225 lb (102.1 kg)   LMP  (LMP Unknown)   BMI 41.15 kg/m  CONSTITUTIONAL: Well-developed, well-nourished female in no acute distress.  HENT:  Normocephalic, atraumatic EYES: Conjunctivae and EOM are normal. No scleral icterus.  NECK: Normal range of motion SKIN: Skin is warm and dry. No rash noted. Not diaphoretic.No pallor. NEUROLGIC: Alert and oriented to person, place, and time. Normal coordination.    Labs and Imaging US Transvaginal Non-ob  Result Date: 07/28/2016 CLINICAL DATA:  Abnormal uterine bleeding for several months. Anemia. EXAM: TRANSABDOMINAL AND TRANSVAGINAL ULTRASOUND OF PELVIS TECHNIQUE: Both transabdominal and transvaginal ultrasound examinations of the pelvis were performed. Transabdominal technique was performed for global imaging of the pelvis including uterus, ovaries, adnexal regions, and pelvic cul-de-sac. It was necessary to proceed with endovaginal exam following the transabdominal exam to visualize the endometrium and cystic adnexal lesions. COMPARISON:  04/19/2014 FINDINGS: Uterus Measurements: 7.9 x 4.9 x 5.9 cm. Retroverted. No fibroids or other mass visualized.  Endometrium Thickness: 17 mm. A focal area of increased echogenicity is seen in the fundal region of the endometrial cavity which measures approximately 10 mm. This raises suspicion for endometrial polyp. Right ovary Measurements: 6.1 x 4.4 x 4.8 cm. Complex cystic lesion is seen within the right ovary which measures 4.5 x 4.6 x 4.3 cm. This lesion shows a solid-appearing mural nodular density measuring approximately 1.1 cm, although no internal blood flow is seen within this area on color Doppler ultrasound. This could be due to internal blood clot or solid mural nodule. No definite malignant characteristics visualized. Left ovary Measurements: 3.4 x 2.9 x 2.3 cm. A small cystic area seen along the margin of the left ovary contains several thin internal septations. This measures approximately 3 cm. No definite malignant characteristics visualized. Other findings No abnormal free fluid. IMPRESSION: Endometrial thickness measures 17 mm, with possible focal endometrial polyp in the fundal region. Consider further evaluation with sonohysterogram for confirmation prior to hysteroscopy. Endometrial sampling should also be considered if patient is at high risk for endometrial carcinoma. (Ref: Radiological Reasoning: Algorithmic Workup of Abnormal Vaginal Bleeding with Endovaginal Sonography and Sonohysterography. AJR 2008; 454:U98-11). 4.6 cm indeterminate but probably benign right ovarian cystic lesion, with differential diagnosis including hemorrhagic cyst and cystic ovarian neoplasm. Recommend continued followup by transvaginal pelvic ultrasound in 6-12 weeks. 3 cm cystic lesion along margin of left ovary, containing several thin internal septations. This also has indeterminate but probably benign characteristics. Differential diagnosis includes hydrosalpinx and cystic ovarian neoplasm. Recommend continued followup by transvaginal pelvic ultrasound in 6-12 weeks. This recommendation follows the consensus statement:  Management of Asymptomatic Ovarian and Other Adnexal  Cysts Imaged at [place]: Society of Radiologists in Ultrasound Consensus Conference Statement. Radiology 2010; [phone]. Electronically Signed   By: Myles Rosenthal M.D.   On: 07/28/2016 13:06   US Pelvis Complete  Result Date: 07/28/2016 CLINICAL DATA:  Abnormal uterine bleeding for several months. Anemia. EXAM: TRANSABDOMINAL AND TRANSVAGINAL ULTRASOUND OF PELVIS TECHNIQUE: Both transabdominal and transvaginal ultrasound examinations of the pelvis were performed. Transabdominal technique was performed for global imaging of the pelvis including uterus, ovaries, adnexal regions, and pelvic cul-de-sac. It was necessary to proceed with endovaginal exam following the transabdominal exam to visualize the endometrium and cystic adnexal lesions. COMPARISON:  04/19/2014 FINDINGS: Uterus Measurements: 7.9 x 4.9 x 5.9 cm. Retroverted. No fibroids or other mass visualized. Endometrium Thickness: 17 mm. A focal area of increased echogenicity is seen in the fundal region of the endometrial cavity which measures approximately 10 mm. This raises suspicion for endometrial polyp. Right ovary Measurements: 6.1 x 4.4 x 4.8 cm. Complex cystic lesion is seen within the right ovary which measures 4.5 x 4.6 x 4.3 cm. This lesion shows a solid-appearing mural nodular density measuring approximately 1.1 cm, although no internal blood flow is seen within this area on color Doppler ultrasound. This could be due to internal blood clot or solid mural nodule. No definite malignant characteristics visualized. Left ovary Measurements: 3.4 x 2.9 x 2.3 cm. A small cystic area seen along the margin of the left ovary contains several thin internal septations. This measures approximately 3 cm. No definite malignant characteristics visualized. Other findings No abnormal free fluid. IMPRESSION: Endometrial thickness measures 17 mm, with possible focal endometrial polyp in the fundal region. Consider  further evaluation with sonohysterogram for confirmation prior to hysteroscopy. Endometrial sampling should also be considered if patient is at high risk for endometrial carcinoma. (Ref: Radiological Reasoning: Algorithmic Workup of Abnormal Vaginal Bleeding with Endovaginal Sonography and Sonohysterography. AJR 2008; 409:W11-91). 4.6 cm indeterminate but probably benign right ovarian cystic lesion, with differential diagnosis including hemorrhagic cyst and cystic ovarian neoplasm. Recommend continued followup by transvaginal pelvic ultrasound in 6-12 weeks. 3 cm cystic lesion along margin of left ovary, containing several thin internal septations. This also has indeterminate but probably benign characteristics. Differential diagnosis includes hydrosalpinx and cystic ovarian neoplasm. Recommend continued followup by transvaginal pelvic ultrasound in 6-12 weeks. This recommendation follows the consensus statement: Management of Asymptomatic Ovarian and Other Adnexal Cysts Imaged at [place]: Society of Radiologists in Ultrasound Consensus Conference Statement. Radiology 2010; [phone]. Electronically Signed   By: Myles Rosenthal M.D.   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  The risks of surgery were discussed in detail with the patient including but not limited to: bleeding which may require transfusion or reoperation; infection which may require prolonged hospitalization or re-hospitalization and antibiotic therapy; injury to bowel, bladder, ureters  and major vessels or other surrounding organs; need for additional procedures including laparotomy; thromboembolic phenomenon, incisional problems and other postoperative or anesthesia complications.  Patient was told that the likelihood that her condition and symptoms will be treated effectively with this surgical management was very high; the postoperative expectations were also discussed in detail. The patient also understands the alternative treatment options which were discussed in full. All questions were answered.  She was told that she will be contacted by our surgical scheduler regarding the time and date of her surgery; routine preoperative instructions of having nothing to eat or drink after midnight on the day prior to surgery and also coming to the hospital 1 1/2 hours prior to her time of surgery were also emphasized.  She was told she may be called for a preoperative appointment about a week prior to surgery and will be given further preoperative instructions at that visit. Printed patient education handouts about the procedure were given to the patient to review at home.  [phone] pt contact info  Total face-to-face time with patient was 15 min.  Greater than 50% was spent in counseling and coordination of care with the patient. We discussed anemia; treatment options for AUB.     Kaesha Kirsch L. Harraway-Smith, M.D., Evern CoreFACOG

## 2016-08-05 LAB — CBC
HCT: 20.2 % — ABNORMAL LOW (ref 35.0–45.0)
Hemoglobin: 5.6 g/dL — CL (ref 11.7–15.5)
MCH: 18.1 pg — ABNORMAL LOW (ref 27.0–33.0)
MCHC: 27.7 g/dL — AB (ref 32.0–36.0)
MCV: 65.4 fL — ABNORMAL LOW (ref 80.0–100.0)
MPV: 10 fL (ref 7.5–12.5)
PLATELETS: 433 10*3/uL — AB (ref 140–400)
RBC: 3.09 MIL/uL — ABNORMAL LOW (ref 3.80–5.10)
RDW: 24.9 % — ABNORMAL HIGH (ref 11.0–15.0)
WBC: 9.2 10*3/uL (ref 3.8–10.8)

## 2016-08-08 ENCOUNTER — Telehealth: Payer: Self-pay | Admitting: General Practice

## 2016-08-08 NOTE — Telephone Encounter (Signed)
Patient called back and I informed her of results & increasing iron Rx. Patient verbalized understanding & asked if she needed to come back for a recheck. Told patient I would send Dr Erin FullingHarraway Smith a message and I would let her know when I hear back from her. Patient verbalized understanding & had no questions

## 2016-08-08 NOTE — Telephone Encounter (Signed)
Per Dr Erin FullingHarraway Smith, patient's hematocrit is still very low and she needs to increase the Integra to twice a day. If her Hgb is not up she cannot have surgery and the continued anemia puts a strain on her heart. Called patient, no answer- left message on voicemail to call us back regarding results. Also called and left message with patient's husband to call us back

## 2016-08-15 ENCOUNTER — Encounter (HOSPITAL_COMMUNITY): Payer: Self-pay

## 2016-08-15 ENCOUNTER — Encounter: Payer: Self-pay | Admitting: General Practice

## 2016-08-25 NOTE — Patient Instructions (Addendum)
Your procedure is scheduled on:  Tomorrow, Feb. 20, 2018  [facility]:  6:00 AM  Pick up the phone at the desk and dial [phone].  Call this number if you have problems the morning of surgery: [phone].  Remember: Do NOT eat food or drink after:  Midnight Tonight  Take these medicines the morning of surgery with a SIP OF WATER:  Atorvastatin, Hydrochlorothiazide, Metoprolol, Omeprazole, Sertraline  Stop ALL herbal medications at this time  Do NOT smoke the day of surgery.  Do NOT wear jewelry (body piercing), metal hair clips/bobby pins, make-up, or nail polish. Do NOT wear lotions, powders, or perfumes.  You may wear deodorant. Do NOT shave for 48 hours prior to surgery. Do NOT bring valuables to the hospital. Contacts, dentures, or bridgework may not be worn into surgery.  Have a responsible adult drive you home and stay with you for 24 hours after your procedure  Bring a copy of your healthcare power of attorney and living will documents.  **Effective Friday, Jan. 12, 2018, [facility] will implement no hospital visitations from children age 38 and younger due to a steady increase in flu activity in our community and hospitals. **

## 2016-08-29 ENCOUNTER — Encounter (HOSPITAL_COMMUNITY): Payer: Self-pay

## 2016-08-29 ENCOUNTER — Encounter (HOSPITAL_COMMUNITY)
Admission: RE | Admit: 2016-08-29 | Discharge: 2016-08-29 | Disposition: A | Discharge status: Home / Self Care | Payer: Medicaid Other | Source: Ambulatory Visit | Attending: Obstetrics & Gynecology | Admitting: Obstetrics & Gynecology

## 2016-08-29 DIAGNOSIS — R102 Pelvic and perineal pain: Secondary | ICD-10-CM | POA: Insufficient documentation

## 2016-08-29 DIAGNOSIS — D500 Iron deficiency anemia secondary to blood loss (chronic): Secondary | ICD-10-CM | POA: Diagnosis not present

## 2016-08-29 DIAGNOSIS — N939 Abnormal uterine and vaginal bleeding, unspecified: Secondary | ICD-10-CM | POA: Insufficient documentation

## 2016-08-29 DIAGNOSIS — Z01812 Encounter for preprocedural laboratory examination: Secondary | ICD-10-CM | POA: Insufficient documentation

## 2016-08-29 HISTORY — DX: Other specified postprocedural states: Z98.890

## 2016-08-29 HISTORY — DX: Cardiac murmur, unspecified: R01.1

## 2016-08-29 HISTORY — DX: Gastro-esophageal reflux disease without esophagitis: K21.9

## 2016-08-29 HISTORY — DX: Nausea with vomiting, unspecified: R11.2

## 2016-08-29 HISTORY — DX: Anemia, unspecified: D64.9

## 2016-08-29 LAB — CBC WITH DIFFERENTIAL/PLATELET
BASOS ABS: 0.1 10*3/uL (ref 0.0–0.1)
BASOS PCT: 1 %
EOS PCT: 2 %
Eosinophils Absolute: 0.1 10*3/uL (ref 0.0–0.7)
HEMATOCRIT: 33.2 % — AB (ref 36.0–46.0)
Hemoglobin: 9.9 g/dL — ABNORMAL LOW (ref 12.0–15.0)
LYMPHS PCT: 31 %
Lymphs Abs: 2.1 10*3/uL (ref 0.7–4.0)
MCH: 22.4 pg — ABNORMAL LOW (ref 26.0–34.0)
MCHC: 29.8 g/dL — ABNORMAL LOW (ref 30.0–36.0)
MCV: 75.3 fL — AB (ref 78.0–100.0)
Monocytes Absolute: 0.2 10*3/uL (ref 0.1–1.0)
Monocytes Relative: 2 %
NEUTROS ABS: 4.4 10*3/uL (ref 1.7–7.7)
Neutrophils Relative %: 65 %
PLATELETS: 344 10*3/uL (ref 150–400)
RBC: 4.41 MIL/uL (ref 3.87–5.11)
RDW: 27.7 % — ABNORMAL HIGH (ref 11.5–15.5)
WBC: 6.8 10*3/uL (ref 4.0–10.5)

## 2016-08-29 LAB — ABO/RH: ABO/RH(D): A NEG

## 2016-08-29 LAB — TYPE AND SCREEN
ABO/RH(D): A NEG
ANTIBODY SCREEN: NEGATIVE

## 2016-08-29 NOTE — Anesthesia Preprocedure Evaluation (Addendum)
Anesthesia Evaluation  Patient identified by MRN, date of birth, ID band Patient awake    Reviewed: Allergy & Precautions, NPO status , Patient's Chart, lab work & pertinent test results  History of Anesthesia Complications (+) PONV  Airway Mallampati: II  TM Distance: >3 FB Neck ROM: Full    Dental  (+) Dental Advisory Given   Pulmonary Current Smoker,    breath sounds clear to auscultation       Cardiovascular hypertension, Pt. on medications and Pt. on home beta blockers  Rhythm:Regular Rate:Normal     Neuro/Psych negative neurological ROS     GI/Hepatic Neg liver ROS, GERD  ,  Endo/Other  Morbid obesity  Renal/GU negative Renal ROS     Musculoskeletal   Abdominal   Peds  Hematology  (+) anemia ,   Anesthesia Other Findings   Reproductive/Obstetrics                            Lab Results  Component Value Date   WBC 6.8 08/29/2016   HGB 9.9 (L) 08/29/2016   HCT 33.2 (L) 08/29/2016   MCV 75.3 (L) 08/29/2016   PLT 344 08/29/2016   Lab Results  Component Value Date   CREATININE 0.53 04/19/2014   BUN 7 04/19/2014   NA 135 (L) 04/19/2014   K 3.8 04/19/2014   CL 100 04/19/2014   CO2 20 04/19/2014    Anesthesia Physical Anesthesia Plan  ASA: III  Anesthesia Plan: General   Post-op Pain Management:    Induction: Intravenous  Airway Management Planned: LMA  Additional Equipment:   Intra-op Plan:   Post-operative Plan: Extubation in OR  Informed Consent: I have reviewed the patients History and Physical, chart, labs and discussed the procedure including the risks, benefits and alternatives for the proposed anesthesia with the patient or authorized representative who has indicated his/her understanding and acceptance.   Dental advisory given  Plan Discussed with:   Anesthesia Plan Comments:        Anesthesia Quick Evaluation

## 2016-08-30 ENCOUNTER — Encounter (HOSPITAL_COMMUNITY)
Admission: RE | Disposition: A | Discharge status: Home / Self Care | Payer: Self-pay | Source: Ambulatory Visit | Attending: Obstetrics & Gynecology

## 2016-08-30 ENCOUNTER — Ambulatory Visit (HOSPITAL_COMMUNITY): Payer: Medicaid Other | Admitting: Anesthesiology

## 2016-08-30 ENCOUNTER — Encounter (HOSPITAL_COMMUNITY): Payer: Self-pay

## 2016-08-30 ENCOUNTER — Ambulatory Visit (HOSPITAL_COMMUNITY)
Admission: RE | Admit: 2016-08-30 | Discharge: 2016-08-30 | Disposition: A | Discharge status: Home / Self Care | Payer: Medicaid Other | Source: Ambulatory Visit | Attending: Obstetrics & Gynecology | Admitting: Obstetrics & Gynecology

## 2016-08-30 DIAGNOSIS — Z825 Family history of asthma and other chronic lower respiratory diseases: Secondary | ICD-10-CM | POA: Insufficient documentation

## 2016-08-30 DIAGNOSIS — Z91040 Latex allergy status: Secondary | ICD-10-CM | POA: Diagnosis not present

## 2016-08-30 DIAGNOSIS — Z9889 Other specified postprocedural states: Secondary | ICD-10-CM | POA: Insufficient documentation

## 2016-08-30 DIAGNOSIS — N840 Polyp of corpus uteri: Secondary | ICD-10-CM | POA: Insufficient documentation

## 2016-08-30 DIAGNOSIS — I10 Essential (primary) hypertension: Secondary | ICD-10-CM | POA: Insufficient documentation

## 2016-08-30 DIAGNOSIS — Z8249 Family history of ischemic heart disease and other diseases of the circulatory system: Secondary | ICD-10-CM | POA: Insufficient documentation

## 2016-08-30 DIAGNOSIS — D500 Iron deficiency anemia secondary to blood loss (chronic): Secondary | ICD-10-CM | POA: Insufficient documentation

## 2016-08-30 DIAGNOSIS — Z833 Family history of diabetes mellitus: Secondary | ICD-10-CM | POA: Diagnosis not present

## 2016-08-30 DIAGNOSIS — F419 Anxiety disorder, unspecified: Secondary | ICD-10-CM | POA: Insufficient documentation

## 2016-08-30 DIAGNOSIS — N939 Abnormal uterine and vaginal bleeding, unspecified: Secondary | ICD-10-CM | POA: Insufficient documentation

## 2016-08-30 DIAGNOSIS — N83209 Unspecified ovarian cyst, unspecified side: Secondary | ICD-10-CM | POA: Diagnosis not present

## 2016-08-30 DIAGNOSIS — Z818 Family history of other mental and behavioral disorders: Secondary | ICD-10-CM | POA: Diagnosis not present

## 2016-08-30 DIAGNOSIS — Z9851 Tubal ligation status: Secondary | ICD-10-CM | POA: Insufficient documentation

## 2016-08-30 DIAGNOSIS — F17210 Nicotine dependence, cigarettes, uncomplicated: Secondary | ICD-10-CM | POA: Diagnosis not present

## 2016-08-30 DIAGNOSIS — Z888 Allergy status to other drugs, medicaments and biological substances status: Secondary | ICD-10-CM | POA: Insufficient documentation

## 2016-08-30 DIAGNOSIS — Z79899 Other long term (current) drug therapy: Secondary | ICD-10-CM | POA: Diagnosis not present

## 2016-08-30 DIAGNOSIS — K219 Gastro-esophageal reflux disease without esophagitis: Secondary | ICD-10-CM | POA: Diagnosis not present

## 2016-08-30 DIAGNOSIS — Z7951 Long term (current) use of inhaled steroids: Secondary | ICD-10-CM | POA: Diagnosis not present

## 2016-08-30 DIAGNOSIS — Z8349 Family history of other endocrine, nutritional and metabolic diseases: Secondary | ICD-10-CM | POA: Diagnosis not present

## 2016-08-30 HISTORY — PX: DILATATION & CURETTAGE/HYSTEROSCOPY WITH MYOSURE: SHX6511

## 2016-08-30 HISTORY — PX: HYSTEROSCOPY WITH NOVASURE: SHX5574

## 2016-08-30 LAB — PREGNANCY, URINE: Preg Test, Ur: NEGATIVE

## 2016-08-30 SURGERY — HYSTEROSCOPY WITH NOVASURE
Anesthesia: General | Site: Vagina

## 2016-08-30 MED ORDER — PROMETHAZINE HCL 25 MG/ML IJ SOLN: 6.2500 mg | INTRAMUSCULAR | Status: DC | PRN

## 2016-08-30 MED ORDER — FENTANYL CITRATE (PF) 100 MCG/2ML IJ SOLN
INTRAMUSCULAR | Status: AC
  Filled 2016-08-30: qty 4

## 2016-08-30 MED ORDER — PROPOFOL 10 MG/ML IV BOLUS
INTRAVENOUS | Status: AC
  Filled 2016-08-30: qty 20

## 2016-08-30 MED ORDER — SCOPOLAMINE 1 MG/3DAYS TD PT72
MEDICATED_PATCH | TRANSDERMAL | Status: AC
  Administered 2016-08-30: 1.5 mg via TRANSDERMAL
  Filled 2016-08-30: qty 1

## 2016-08-30 MED ORDER — GLYCOPYRROLATE 0.2 MG/ML IJ SOLN
INTRAMUSCULAR | Status: DC | PRN
  Administered 2016-08-30: 0.1 mg via INTRAVENOUS

## 2016-08-30 MED ORDER — ONDANSETRON HCL 4 MG/2ML IJ SOLN
INTRAMUSCULAR | Status: AC
  Filled 2016-08-30: qty 2

## 2016-08-30 MED ORDER — LIDOCAINE HCL (CARDIAC) 20 MG/ML IV SOLN
INTRAVENOUS | Status: AC
  Filled 2016-08-30: qty 5

## 2016-08-30 MED ORDER — SCOPOLAMINE 1 MG/3DAYS TD PT72
1.0000 | MEDICATED_PATCH | Freq: Once | TRANSDERMAL | Status: DC
  Administered 2016-08-30: 1.5 mg via TRANSDERMAL

## 2016-08-30 MED ORDER — METOCLOPRAMIDE HCL 5 MG/ML IJ SOLN
INTRAMUSCULAR | Status: AC
  Filled 2016-08-30: qty 2

## 2016-08-30 MED ORDER — ONDANSETRON HCL 4 MG/2ML IJ SOLN
INTRAMUSCULAR | Status: DC | PRN
  Administered 2016-08-30: 4 mg via INTRAVENOUS

## 2016-08-30 MED ORDER — METOCLOPRAMIDE HCL 5 MG/ML IJ SOLN
INTRAMUSCULAR | Status: DC | PRN
  Administered 2016-08-30: 10 mg via INTRAVENOUS

## 2016-08-30 MED ORDER — HYDROMORPHONE HCL 1 MG/ML IJ SOLN: 0.2500 mg | INTRAMUSCULAR | Status: DC | PRN

## 2016-08-30 MED ORDER — KETOROLAC TROMETHAMINE 30 MG/ML IJ SOLN
INTRAMUSCULAR | Status: DC | PRN
  Administered 2016-08-30: 30 mg via INTRAVENOUS

## 2016-08-30 MED ORDER — DEXAMETHASONE SODIUM PHOSPHATE 10 MG/ML IJ SOLN
INTRAMUSCULAR | Status: AC
  Filled 2016-08-30: qty 1

## 2016-08-30 MED ORDER — IBUPROFEN 600 MG PO TABS: 600.0000 mg | ORAL_TABLET | Freq: Four times a day (QID) | ORAL | 0 refills | Status: DC | PRN

## 2016-08-30 MED ORDER — BUPIVACAINE HCL (PF) 0.5 % IJ SOLN
INTRAMUSCULAR | Status: AC
  Filled 2016-08-30: qty 30

## 2016-08-30 MED ORDER — LACTATED RINGERS IV SOLN
INTRAVENOUS | Status: DC
  Administered 2016-08-30 (×2): via INTRAVENOUS

## 2016-08-30 MED ORDER — DEXAMETHASONE SODIUM PHOSPHATE 4 MG/ML IJ SOLN
INTRAMUSCULAR | Status: DC | PRN
  Administered 2016-08-30: 10 mg via INTRAVENOUS

## 2016-08-30 MED ORDER — HYDROMORPHONE HCL 1 MG/ML IJ SOLN
INTRAMUSCULAR | Status: AC
  Filled 2016-08-30: qty 1

## 2016-08-30 MED ORDER — LACTATED RINGERS IR SOLN
Status: DC | PRN
Start: 2016-08-30 — End: 2016-08-30
  Administered 2016-08-30: 3000 mL

## 2016-08-30 MED ORDER — MIDAZOLAM HCL 2 MG/2ML IJ SOLN
INTRAMUSCULAR | Status: DC | PRN
  Administered 2016-08-30 (×2): 1 mg via INTRAVENOUS

## 2016-08-30 MED ORDER — PROPOFOL 10 MG/ML IV BOLUS
INTRAVENOUS | Status: DC | PRN
  Administered 2016-08-30: 200 mg via INTRAVENOUS

## 2016-08-30 MED ORDER — ROCURONIUM BROMIDE 100 MG/10ML IV SOLN
INTRAVENOUS | Status: AC
  Filled 2016-08-30: qty 1

## 2016-08-30 MED ORDER — FENTANYL CITRATE (PF) 250 MCG/5ML IJ SOLN
INTRAMUSCULAR | Status: DC | PRN
  Administered 2016-08-30 (×4): 50 ug via INTRAVENOUS

## 2016-08-30 MED ORDER — BUPIVACAINE HCL (PF) 0.5 % IJ SOLN
INTRAMUSCULAR | Status: DC | PRN
  Administered 2016-08-30: 10 mL

## 2016-08-30 MED ORDER — LIDOCAINE HCL (CARDIAC) 20 MG/ML IV SOLN
INTRAVENOUS | Status: DC | PRN
  Administered 2016-08-30: 100 mg via INTRAVENOUS

## 2016-08-30 MED ORDER — GLYCOPYRROLATE 0.2 MG/ML IJ SOLN
INTRAMUSCULAR | Status: AC
  Filled 2016-08-30: qty 1

## 2016-08-30 MED ORDER — MIDAZOLAM HCL 2 MG/2ML IJ SOLN
INTRAMUSCULAR | Status: AC
  Filled 2016-08-30: qty 2

## 2016-08-30 MED ORDER — KETOROLAC TROMETHAMINE 30 MG/ML IJ SOLN
INTRAMUSCULAR | Status: AC
  Filled 2016-08-30: qty 1

## 2016-08-30 SURGICAL SUPPLY — 17 items
ABLATOR ENDOMETRIAL BIPOLAR (ABLATOR) ×3 IMPLANT
CANISTER SUCT 3000ML (MISCELLANEOUS) ×6 IMPLANT
CATH ROBINSON RED A/P 16FR (CATHETERS) ×3 IMPLANT
CLOTH BEACON ORANGE TIMEOUT ST (SAFETY) ×3 IMPLANT
CONTAINER PREFILL 10% NBF 60ML (FORM) ×6 IMPLANT
DEVICE MYOSURE REACH (MISCELLANEOUS) ×3 IMPLANT
GLOVE BIO SURGEON STRL SZ7 (GLOVE) ×3 IMPLANT
GLOVE BIOGEL PI IND STRL 7.0 (GLOVE) ×2 IMPLANT
GLOVE BIOGEL PI INDICATOR 7.0 (GLOVE) ×4
GOWN STRL REUS W/TWL LRG LVL3 (GOWN DISPOSABLE) ×6 IMPLANT
GOWN STRL REUS W/TWL XL LVL3 (GOWN DISPOSABLE) ×3 IMPLANT
PACK VAGINAL MINOR WOMEN LF (CUSTOM PROCEDURE TRAY) ×3 IMPLANT
PAD OB MATERNITY 4.3X12.25 (PERSONAL CARE ITEMS) ×3 IMPLANT
SEAL ROD LENS SCOPE MYOSURE (ABLATOR) ×3 IMPLANT
TOWEL OR 17X24 6PK STRL BLUE (TOWEL DISPOSABLE) ×6 IMPLANT
TUBING AQUILEX INFLOW (TUBING) ×3 IMPLANT
WATER STERILE IRR 1000ML POUR (IV SOLUTION) ×3 IMPLANT

## 2016-08-30 NOTE — Anesthesia Postprocedure Evaluation (Signed)
Anesthesia Post Note  Patient: Shelley Ford  Procedure(s) Performed: Procedure(s) (LRB): HYSTEROSCOPY WITH NOVASURE (N/A) DILATATION & CURETTAGE/HYSTEROSCOPY WITH MYOSURE (N/A)  Patient location during evaluation: PACU Anesthesia Type: General Level of consciousness: awake and alert Pain management: pain level controlled Vital Signs Assessment: post-procedure vital signs reviewed and stable Respiratory status: spontaneous breathing, nonlabored ventilation, respiratory function stable and patient connected to nasal cannula oxygen Cardiovascular status: blood pressure returned to baseline and stable Postop Assessment: no signs of nausea or vomiting Anesthetic complications: no        Last Vitals:  Vitals:   08/30/16 0917 08/30/16 0945  BP: (!) 161/84 (!) 142/74  Pulse: 74 72  Resp: 18 20  Temp: 36.8 C     Last Pain:  Vitals:   08/30/16 0917  TempSrc: Oral   Pain Goal: Patients Stated Pain Goal: 3 (08/30/16 0607)               Kennieth RadFitzgerald, Ronette Hank E

## 2016-08-30 NOTE — Anesthesia Procedure Notes (Signed)
Procedure Name: LMA Insertion Date/Time: 08/30/2016 7:25 AM Performed by: Graciela HusbandsFUSSELL, Kalee Broxton O Pre-anesthesia Checklist: Patient identified, Patient being monitored, Emergency Drugs available, Timeout performed and Suction available Patient Re-evaluated:Patient Re-evaluated prior to inductionOxygen Delivery Method: Circle System Utilized Preoxygenation: Pre-oxygenation with 100% oxygen Intubation Type: IV induction LMA: LMA inserted LMA Size: 4.0 Number of attempts: 1 Placement Confirmation: positive ETCO2 and breath sounds checked- equal and bilateral Tube secured with: Tape Dental Injury: Teeth and Oropharynx as per pre-operative assessment

## 2016-08-30 NOTE — Op Note (Signed)
08/30/2016  8:34 AM  PATIENT:  Shelley Ford  36 y.o. female  PRE-OPERATIVE DIAGNOSIS:  Abnornal Uterine Bleeding [id number]  POST-OPERATIVE DIAGNOSIS:  Abnornal Uterine Bleeding  PROCEDURE:  Procedure(s): HYSTEROSCOPY WITH NOVASURE (N/A) DILATATION & CURETTAGE/HYSTEROSCOPY WITH MYOSURE (N/A)  SURGEON:  Surgeon(s) and Role:    * Willodean Rosenthalarolyn Harraway-Smith, MD - Primary  ANESTHESIA:   general and paracervical block  EBL:  Total I/O In: 1500 [I.V.:1500] Out: 20 [Blood:20]  BLOOD ADMINISTERED:none  DRAINS: none   LOCAL MEDICATIONS USED:  MARCAINE     SPECIMEN:  Source of Specimen:  endometrial curettings  DISPOSITION OF SPECIMEN:  PATHOLOGY  COUNTS:  YES  TOURNIQUET:  * No tourniquets in log *  DICTATION: .Note written in EPIC  PLAN OF CARE: Discharge to home after PACU  PATIENT DISPOSITION:  PACU - hemodynamically stable.   Delay start of Pharmacological VTE agent (>24hrs) due to surgical blood loss or risk of bleeding: not applicable  Complications: none immediate  The risks, benefits, and alternatives of surgery were explained, understood, and accepted. The consents were signed and all questions were answered. She was taken to the operating room and general anesthesia was applied without complication. She was placed in the dorsal lithotomy position and her vagina and abdomen were prepped and draped in the usual sterile fashion. A bimanual exam revealed a normal size and shape anteverted mobile uterus. Her adnexa were non-enlarged.   A bivalved speculum was placed in the patients' vagina and the anterior lip of the cervix was grasped with a single toothed tenaculum. A paracervical block was performed at 5 and 7 o'clock with 10cc of 0.5% Marcaine.   The endometrial cavity was sounded to 10.5cm and the endocervical length measured 3cm. A hysteroscope was inserted and the endometrium was noted to be thickened with several polyps.  The ostia on both sides were noted.  The scope was  removed and a sharp currete was used to scape the lining of the uterus until a gritty texture was noted throughout.  Specimens were sent to pathology.  The NovaSure device was then inserted and seated using 6.5cm as the cavity length and 2.7cm as the cavity width.  The total activation time was 107 sec at a power of 164.  The hysteroscope was reinserted and an even burn pattern was noted to the fundus however it was noted that there was a fibroid or a polyp that was not seen prior to the ablation on the right anterior wall of the uterus.  At this point the Myosure operative hysteroscope was inserted.  The fibroid was resected using the Myosure device.  After further careful visualization of the uterine cavity, the hysteroscope was removed under direct visualization.  The tenaculum was removed from the anterior lip of the cervix and the vaginal speculum was removed after noting good hemostasis.  The patient tolerated the procedure well and was taken to the recovery area awake, extubated and in stable condition.  Seylah Wernert L. Harraway-Smith, M.D., [facility] CoreFACOG

## 2016-08-30 NOTE — Discharge Instructions (Signed)
DISCHARGE INSTRUCTIONS: D&C / D&E The following instructions have been prepared to help you care for yourself upon your return home.   Personal hygiene:  Use sanitary pads for vaginal drainage, not tampons.  Shower the day after your procedure.  NO tub baths, pools or Jacuzzis for 2-3 weeks.  Wipe front to back after using the bathroom.  Activity and limitations:  Do NOT drive or operate any equipment for 24 hours. The effects of anesthesia are still present and drowsiness may result.  Do NOT rest in bed all day.  Walking is encouraged.  Walk up and down stairs slowly.  You may resume your normal activity in one to two days or as indicated by your physician.  Sexual activity: NO intercourse for at least 2 weeks after the procedure, or as indicated by your physician.  Diet: Eat a light meal as desired this evening. You may resume your usual diet tomorrow.  Return to work: You may resume your work activities in one to two days or as indicated by your doctor.  What to expect after your surgery: Expect to have vaginal bleeding/discharge for 2-3 days and spotting for up to 10 days. It is not unusual to have soreness for up to 1-2 weeks. You may have a slight burning sensation when you urinate for the first day. Mild cramps may continue for a couple of days. You may have a regular period in 2-6 weeks.  Call your doctor for any of the following:  Excessive vaginal bleeding, saturating and changing one pad every hour.  Inability to urinate 6 hours after discharge from hospital.  Pain not relieved by pain medication.  Fever of 100.4 F or greater.  Unusual vaginal discharge or odor.   Call for an appointment:    Patients signature: ______________________  Nurses signature ________________________  Support person's signature_______________________   Endometrial Ablation Endometrial ablation removes the lining of the uterus (endometrium). It is usually a same-day,  outpatient treatment. Ablation helps avoid major surgery, such as surgery to remove the cervix and uterus (hysterectomy). After endometrial ablation, you will have little or no menstrual bleeding and may not be able to have children. However, if you are premenopausal, you will need to use a reliable method of birth control following the procedure because of the small chance that pregnancy can occur. There are different reasons to have this procedure. These reasons include:  Heavy periods.  Bleeding that is causing anemia.  Irregular bleeding.  Bleeding fibroids on the lining inside the uterus if they are smaller than 3 centimeters. This procedure may not be possible for you if:   You want to have children in the future.   You have severe cramps with your menstrual period.   You have precancerous or cancerous cells in your uterus.   You were recently pregnant.   You have gone through menopause.   You have had major surgery on your uterus, resulting in thinning of the uterine wall. Surgeries may include:  The removal of one or more uterine fibroids (myomectomy).  A cesarean section with a classic (vertical) incision on your uterus. Ask your health care provider what type of cesarean you had. Sometimes the scar on your skin is different than the scar on your uterus. Even if you have had surgery on your uterus, certain types of ablation may still be safe for you. Talk with your health care provider. LET [facility] PROVID[facility] KNOW ABOUT:  Any allergies you have.  All medicines you are  taking, including vitamins, herbs, eye drops, creams, and over-the-counter medicines.  Previous problems you or members of your family have had with the use of anesthetics.  Any blood disorders you have.  Previous surgeries you have had.  Medical conditions you have. RISKS AND COMPLICATIONS  Generally, this is a safe procedure. However, as with any procedure, complications can occur.  Possible complications include:  Perforation of the uterus.  Bleeding.  Infection of the uterus, bladder, or vagina.  Injury to surrounding organs.  An air bubble to the lung (air embolus).  Pregnancy following the procedure.  Failure of the procedure to help the problem, requiring hysterectomy.  Decreased ability to diagnose cancer in the lining of the uterus. BEFORE THE PROCEDURE  The lining of the uterus must be tested to make sure there is no pre-cancerous or cancer cells present.  An ultrasound may be performed to look at the size of the uterus and to check for abnormalities.  Medicines may be given to thin the lining of the uterus. PROCEDURE  During the procedure, your health care provider will use a tool called a resectoscope to help see inside your uterus. There are different ways to remove the lining of your uterus.   Radiofrequency - This method uses a radiofrequency-alternating electric current to remove the lining of the uterus.  Cryotherapy - This method uses extreme cold to freeze the lining of the uterus.  Heated-Free Liquid - This method uses heated salt (saline) solution to remove the lining of the uterus.  Microwave - This method uses high-energy microwaves to heat up the lining of the uterus to remove it.  Thermal balloon - This method involves inserting a catheter with a balloon tip into the uterus. The balloon tip is filled with heated fluid to remove the lining of the uterus. AFTER THE PROCEDURE  After your procedure, do not have sexual intercourse or insert anything into your vagina until permitted by your health care provider. After the procedure, you may experience:  Cramps.  Vaginal discharge.  Frequent urination. This information is not intended to replace advice given to you by your health care provider. Make sure you discuss any questions you have with your health care provider. Document Released: 05/06/2004 Document Revised: 03/18/2015  Document Reviewed: 11/28/2012 Elsevier Interactive Patient Education  2017 [employer] Hysteroscopy, Care After Refer to this sheet in the next few weeks. These instructions provide you with information on caring for yourself after your procedure. Your health care provider may also give you more specific instructions. Your treatment has been planned according to current medical practices, but problems sometimes occur. Call your health care provider if you have any problems or questions after your procedure.  WHAT TO EXPECT AFTER THE PROCEDURE After your procedure, it is typical to have the following:  You may have some cramping. This normally lasts for a couple days.  You may have bleeding. This can vary from light spotting for a few days to menstrual-like bleeding for 3-7 days. HOME CARE INSTRUCTIONS  Rest for the first 1-2 days after the procedure.  Only take over-the-counter or prescription medicines as directed by your health care provider. Do not take aspirin. It can increase the chances of bleeding.  Take showers instead of baths for 2 weeks or as directed by your health care provider.  Do not drive for 24 hours or as directed.  Do not drink alcohol while taking pain medicine.  Do not use tampons, douche, or have sexual intercourse for 2 weeks  or until your health care provider says it is okay.  Take your temperature twice a day for 4-5 days. Write it down each time.  Follow your health care provider's advice about diet, exercise, and lifting.  If you develop constipation, you may:  Take a mild laxative if your health care provider approves.  Add bran foods to your diet.  Drink enough fluids to keep your urine clear or pale yellow.  Try to have someone with you or available to you for the first 24-48 hours, especially if you were given a general anesthetic.  Follow up with your health care provider as directed. SEEK MEDICAL CARE IF:  You feel dizzy or  lightheaded.  You feel sick to your stomach (nauseous).  You have abnormal vaginal discharge.  You have a rash.  You have pain that is not controlled with medicine. SEEK IMMEDIATE MEDICAL CARE IF:  You have bleeding that is heavier than a normal menstrual period.  You have a fever.  You have increasing cramps or pain, not controlled with medicine.  You have new belly (abdominal) pain.  You pass out.  You have pain in the tops of your shoulders (shoulder strap areas).  You have shortness of breath. This information is not intended to replace advice given to you by your health care provider. Make sure you discuss any questions you have with your health care provider. Document Released: 04/17/2013 Document Reviewed: 04/17/2013 Elsevier Interactive Patient Education  2017 [employer].

## 2016-08-30 NOTE — H&P (Signed)
Preoperative History and Physical  Shelley Ford is a 36 y.o. [id number] here for surgical management of AUB and severe anemia.   Proposed surgery: hysteroscopy with endometrial ablation and possible polypectomy  Past Medical History:  Diagnosis Date  . Anemia   . Anxiety   . GERD (gastroesophageal reflux disease)   . Heart murmur   . Hypertension   . Ovarian cyst   . Pelvic pain in female   . PONV (postoperative nausea and vomiting)   . Reflux    Past Surgical History:  Procedure Laterality Date  . BLADDER SURGERY    . CESAREAN SECTION    . TUBAL LIGATION    . WISDOM TOOTH EXTRACTION     OB History    Gravida Para Term Preterm AB Living   5 4 2 2 1      SAB TAB Ectopic Multiple Live Births   1             Patient denies any cervical dysplasia or STIs. Prescriptions Prior to Admission  Medication Sig Dispense Refill Last Dose  . atorvastatin (LIPITOR) 20 MG tablet Take 20 mg by mouth daily.   08/30/2016 at 0530  . Fe Fum-FePoly-FA-Vit C-Vit B3 (INTEGRA F) 125-1 MG CAPS Take 1 capsule by mouth daily. 30 capsule 3 08/29/2016 at Unknown time  . fexofenadine (ALLEGRA) 180 MG tablet Take 180 mg by mouth daily.   08/29/2016 at Unknown time  . fluticasone (FLONASE) 50 MCG/ACT nasal spray Place 1 spray into both nostrils daily as needed. For allergies/congestion.  2 Past Week at Unknown time  . hydrochlorothiazide (HYDRODIURIL) 25 MG tablet Take 25 mg by mouth daily.   08/30/2016 at 0530  . megestrol (MEGACE) 40 MG tablet Take 1 tablet (40 mg total) by mouth 2 (two) times daily. Can increase to two tablets twice a day in the event of heavy bleeding (Patient taking differently: Take 40 mg by mouth daily. ) 60 tablet 1 Past Week at Unknown time  . metoprolol succinate (TOPROL-XL) 50 MG 24 hr tablet Take 50 mg by mouth daily. Take with or immediately following a meal.   08/30/2016 at 0530  . omeprazole (PRILOSEC OTC) 20 MG tablet Take 20 mg by mouth daily as needed (for heartburn/indigestion.).     Past Week at Unknown time  . sertraline (ZOLOFT) 100 MG tablet Take 100 mg by mouth daily.    08/30/2016 at 0530    Allergies  Allergen Reactions  . Dimetapp Decongestant [Pseudoephedrine]     Unknown. Child hood allergy.   . Latex Itching and Rash   Social History:   reports that she has been smoking Cigarettes.  She has a 1.75 pack-year smoking history. She has never used smokeless tobacco. She reports that she does not drink alcohol or use drugs. Family History  Problem Relation Age of Onset  . Hypertension Mother   . Hyperlipidemia Mother   . Asthma Mother   . Heart disease Mother   . Depression Mother   . Hypertension Father   . Diabetes Father     Review of Systems: Noncontributory  PHYSICAL EXAM: Blood pressure (!) 148/76, pulse 63, temperature 97.9 F (36.6 C), temperature source Oral, resp. rate 20, SpO2 98 %. General appearance - alert, well appearing, and in no distress Chest - clear to auscultation, no wheezes, rales or rhonchi, symmetric air entry Heart - normal rate and regular rhythm Abdomen - soft, nontender, nondistended, no masses or organomegaly Pelvic - examination not indicated Extremities - peripheral  pulses normal, no pedal edema, no clubbing or cyanosis  Labs: Results for orders placed or performed during the hospital encounter of 08/30/16 (from the past 336 hour(s))  Pregnancy, urine   Collection Time: 08/30/16  6:00 AM  Result Value Ref Range   Preg Test, Ur NEGATIVE NEGATIVE  Results for orders placed or performed during the hospital encounter of 08/29/16 (from the past 336 hour(s))  CBC WITH DIFFERENTIAL   Collection Time: 08/29/16 11:00 AM  Result Value Ref Range   WBC 6.8 4.0 - 10.5 K/uL   RBC 4.41 3.87 - 5.11 MIL/uL   Hemoglobin 9.9 (L) 12.0 - 15.0 g/dL   HCT 16.1 (L) 09.6 - 04.5 %   MCV 75.3 (L) 78.0 - 100.0 fL   MCH 22.4 (L) 26.0 - 34.0 pg   MCHC 29.8 (L) 30.0 - 36.0 g/dL   RDW 40.9 (H) 81.1 - 91.4 %   Platelets 344 150 - 400 K/uL    Neutrophils Relative % 65 %   Neutro Abs 4.4 1.7 - 7.7 K/uL   Lymphocytes Relative 31 %   Lymphs Abs 2.1 0.7 - 4.0 K/uL   Monocytes Relative 2 %   Monocytes Absolute 0.2 0.1 - 1.0 K/uL   Eosinophils Relative 2 %   Eosinophils Absolute 0.1 0.0 - 0.7 K/uL   Basophils Relative 1 %   Basophils Absolute 0.1 0.0 - 0.1 K/uL  Type and screen   Collection Time: 08/29/16 11:00 AM  Result Value Ref Range   ABO/RH(D) A NEG    Antibody Screen NEG    Sample Expiration 09/12/2016    Extend sample reason NO TRANSFUSIONS OR PREGNANCY IN THE PAST 3 MONTHS   ABO/Rh   Collection Time: 08/29/16 11:05 AM  Result Value Ref Range   ABO/RH(D) A NEG     Imaging Studies: No results found.  Assessment: Patient Active Problem List   Diagnosis Date Noted  . Anemia due to chronic blood loss 08/04/2016  . Abnormal uterine bleeding (AUB) 08/04/2016    Plan: Patient will undergo surgical management with hysteroscopy with endometrial ablation and possible polypectomy..   The risks of surgery were discussed in detail with the patient including but not limited to: bleeding which may require transfusion or reoperation; infection which may require antibiotics; injury to surrounding organs which may involve bowel, bladder, ureters ; need for additional procedures including laparoscopy or laparotomy; thromboembolic phenomenon, surgical site problems and other postoperative/anesthesia complications. Likelihood of success in alleviating the patient's condition was discussed. Routine postoperative instructions will be reviewed with the patient and her family in detail after surgery.  The patient concurred with the proposed plan, giving informed written consent for the surgery.  Patient has been NPO since last night she will remain NPO for procedure.  Anesthesia and OR aware.  Preoperative prophylactic antibiotics and SCDs ordered on call to the OR.  To OR when ready.  Jeter Tomey L. Harraway-Smith, M.D., [facility] 08/30/2016 7:08  AM

## 2016-08-30 NOTE — Brief Op Note (Signed)
08/30/2016  8:34 AM  PATIENT:  Arna Mediciora Lockner  36 y.o. female  PRE-OPERATIVE DIAGNOSIS:  Abnornal Uterine Bleeding [id number]  POST-OPERATIVE DIAGNOSIS:  Abnornal Uterine Bleeding  PROCEDURE:  Procedure(s): HYSTEROSCOPY WITH NOVASURE (N/A) DILATATION & CURETTAGE/HYSTEROSCOPY WITH MYOSURE (N/A)  SURGEON:  Surgeon(s) and Role:    * Willodean Rosenthalarolyn Harraway-Smith, MD - Primary  ANESTHESIA:   general and paracervical block  EBL:  Total I/O In: 1500 [I.V.:1500] Out: 20 [Blood:20]  BLOOD ADMINISTERED:none  DRAINS: none   LOCAL MEDICATIONS USED:  MARCAINE     SPECIMEN:  Source of Specimen:  endometrial curettings  DISPOSITION OF SPECIMEN:  PATHOLOGY  COUNTS:  YES  TOURNIQUET:  * No tourniquets in log *  DICTATION: .Note written in EPIC  PLAN OF CARE: Discharge to home after PACU  PATIENT DISPOSITION:  PACU - hemodynamically stable.   Delay start of Pharmacological VTE agent (>24hrs) due to surgical blood loss or risk of bleeding: not applicable  Complications: none immediate  Julaine Zimny L. Harraway-Smith, M.D., [facility]

## 2016-08-30 NOTE — Transfer of Care (Signed)
Immediate Anesthesia Transfer of Care Note  Patient: Shelley Ford  Procedure(s) Performed: Procedure(s): HYSTEROSCOPY WITH NOVASURE (N/A) DILATATION & CURETTAGE/HYSTEROSCOPY WITH MYOSURE (N/A)  Patient Location: PACU  Anesthesia Type:General  Level of Consciousness: awake, alert  and oriented  Airway & Oxygen Therapy: Patient Spontanous Breathing and Patient connected to nasal cannula oxygen  Post-op Assessment: Report given to RN and Post -op Vital signs reviewed and stable  Post vital signs: Reviewed and stable  Last Vitals:  Vitals:   08/30/16 0607  BP: (!) 148/76  Pulse: 63  Resp: 20  Temp: 36.6 C    Last Pain:  Vitals:   08/30/16 0607  TempSrc: Oral      Patients Stated Pain Goal: 3 (08/30/16 [id number])  Complications: No apparent anesthesia complications

## 2016-09-01 ENCOUNTER — Encounter (HOSPITAL_COMMUNITY): Payer: Self-pay | Admitting: Obstetrics & Gynecology

## 2016-09-23 ENCOUNTER — Ambulatory Visit (INDEPENDENT_AMBULATORY_CARE_PROVIDER_SITE_OTHER): Payer: Medicaid Other | Admitting: Obstetrics & Gynecology

## 2016-09-23 ENCOUNTER — Encounter: Payer: Self-pay | Admitting: Obstetrics & Gynecology

## 2016-09-23 VITALS — BP 145/85 | HR 82 | Ht 62.0 in | Wt 220.0 lb

## 2016-09-23 DIAGNOSIS — D500 Iron deficiency anemia secondary to blood loss (chronic): Secondary | ICD-10-CM

## 2016-09-23 DIAGNOSIS — N939 Abnormal uterine and vaginal bleeding, unspecified: Secondary | ICD-10-CM

## 2016-09-23 MED ORDER — INTEGRA F 125-1 MG PO CAPS
1.0000 | ORAL_CAPSULE | Freq: Every day | ORAL | 3 refills | Status: DC
Start: 2016-09-23 — End: 2017-04-13

## 2016-09-23 NOTE — Patient Instructions (Signed)
Endometrial Ablation Endometrial ablation is a procedure that destroys the thin inner layer of the lining of the uterus (endometrium). This procedure may be done:  To stop heavy periods.  To stop bleeding that is causing anemia.  To control irregular bleeding.  To treat bleeding caused by small tumors (fibroids) in the endometrium.  This procedure is often an alternative to major surgery, such as removal of the uterus and cervix (hysterectomy). As a result of this procedure:  You may not be able to have children. However, if you are premenopausal (you have not gone through menopause): ? You may still have a small chance of getting pregnant. ? You will need to use a reliable method of birth control after the procedure to prevent pregnancy.  You may stop having a menstrual period, or you may have only a small amount of bleeding during your period. Menstruation may return several years after the procedure.  Tell a health care provider about:  Any allergies you have.  All medicines you are taking, including vitamins, herbs, eye drops, creams, and over-the-counter medicines.  Any problems you or family members have had with the use of anesthetic medicines.  Any blood disorders you have.  Any surgeries you have had.  Any medical conditions you have. What are the risks? Generally, this is a safe procedure. However, problems may occur, including:  A hole (perforation) in the uterus or bowel.  Infection of the uterus, bladder, or vagina.  Bleeding.  Damage to other structures or organs.  An air bubble in the lung (air embolus).  Problems with pregnancy after the procedure.  Failure of the procedure.  Decreased ability to diagnose cancer in the endometrium.  What happens before the procedure?  You will have tests of your endometrium to make sure there are no pre-cancerous cells or cancer cells present.  You may have an ultrasound of the uterus.  You may be given  medicines to thin the endometrium.  Ask your health care provider about: ? Changing or stopping your regular medicines. This is especially important if you take diabetes medicines or blood thinners. ? Taking medicines such as aspirin and ibuprofen. These medicines can thin your blood. Do not take these medicines before your procedure if your doctor tells you not to.  Plan to have someone take you home from the hospital or clinic. What happens during the procedure?  You will lie on an exam table with your feet and legs supported as in a pelvic exam.  To lower your risk of infection: ? Your health care team will wash or sanitize their hands and put on germ-free (sterile) gloves. ? Your genital area will be washed with soap.  An IV tube will be inserted into one of your veins.  You will be given a medicine to help you relax (sedative).  A surgical instrument with a light and camera (resectoscope) will be inserted into your vagina and moved into your uterus. This allows your surgeon to see inside your uterus.  Endometrial tissue will be removed using one of the following methods: ? Radiofrequency. This method uses a radiofrequency-alternating electric current to remove the endometrium. ? Cryotherapy. This method uses extreme cold to freeze the endometrium. ? Heated-free liquid. This method uses a heated saltwater (saline) solution to remove the endometrium. ? Microwave. This method uses high-energy microwaves to heat up the endometrium and remove it. ? Thermal balloon. This method involves inserting a catheter with a balloon tip into the uterus. The balloon tip is   filled with heated fluid to remove the endometrium. The procedure may vary among health care providers and hospitals. What happens after the procedure?  Your blood pressure, heart rate, breathing rate, and blood oxygen level will be monitored until the medicines you were given have worn off.  As tissue healing occurs, you may  notice vaginal bleeding for 4-6 weeks after the procedure. You may also experience: ? Cramps. ? Thin, watery vaginal discharge that is light pink or brown in color. ? A need to urinate more frequently than usual. ? Nausea.  Do not drive for 24 hours if you were given a sedative.  Do not have sex or insert anything into your vagina until your health care provider approves. Summary  Endometrial ablation is done to treat the many causes of heavy menstrual bleeding.  The procedure may be done only after medications have been tried to control the bleeding.  Plan to have someone take you home from the hospital or clinic. This information is not intended to replace advice given to you by your health care provider. Make sure you discuss any questions you have with your health care provider. Document Released: 05/06/2004 Document Revised: 07/14/2016 Document Reviewed: 07/14/2016 Elsevier Interactive Patient Education  2017 [employer] Inc[employer]  

## 2016-09-23 NOTE — Progress Notes (Signed)
History:  36 y.o. [id number] here today for post op check. Pt is s/p Endo ablation with 08/30/16. She reports that her bleeding is markedly improved from prior to the procedure.  She reports 'heavy spotting' on most days and reports that even that is improving. She denies pain.  The following portions of the patient's history were reviewed and updated as appropriate: allergies, current medications, past family history, past medical history, past social history, past surgical history and problem list.  Review of Systems:  Pertinent items are noted in HPI.   Objective:  Physical Exam Blood pressure (!) 145/85, pulse 82, height 5\' 2"  (1.575 m), weight 220 lb (99.8 kg). Gen: NAD Abd: Soft, nontender and nondistended Pelvic: deferred  Labs and Imaging 08/30/2016 surg path Diagnosis 1. Endometrium, curettage - BENIGN POLYPOID ENDOMETRIAL TISSUE WITH PROGESTATIONAL EFFECT. - NO HYPERPLASIA, ATYPIA, OR MALIGNANCY IDENTIFIED. - SEE COMMENT. 2. Endometrial polyp - BENIGN POLYPOID ENDOMETRIAL TISSUE WITH PROGESTATIONAL EFFECT. - NO HYPERPLASIA, ATYPIA, OR MALIGNANCY IDENTIFIED. - SEE COMMENT.  Assessment & Plan:  4 week post op check- Doing well  Rec CBC Cont Integra 1 po q day  Rayhaan Huster L. Harraway-Smith, M.D., [facility]

## 2016-09-24 LAB — CBC WITH DIFFERENTIAL/PLATELET
Basophils Absolute: 0 10*3/uL (ref 0.0–0.2)
Basos: 1 %
EOS (ABSOLUTE): 0.1 10*3/uL (ref 0.0–0.4)
EOS: 1 %
HEMOGLOBIN: 10.8 g/dL — AB (ref 11.1–15.9)
Hematocrit: 35.7 % (ref 34.0–46.6)
Immature Grans (Abs): 0 10*3/uL (ref 0.0–0.1)
Immature Granulocytes: 0 %
LYMPHS ABS: 2.8 10*3/uL (ref 0.7–3.1)
Lymphs: 32 %
MCH: 22.8 pg — AB (ref 26.6–33.0)
MCHC: 30.3 g/dL — AB (ref 31.5–35.7)
MCV: 76 fL — ABNORMAL LOW (ref 79–97)
MONOS ABS: 0.3 10*3/uL (ref 0.1–0.9)
Monocytes: 3 %
Neutrophils Absolute: 5.5 10*3/uL (ref 1.4–7.0)
Neutrophils: 63 %
Platelets: 383 10*3/uL — ABNORMAL HIGH (ref 150–379)
RBC: 4.73 x10E6/uL (ref 3.77–5.28)
RDW: 22.1 % — ABNORMAL HIGH (ref 12.3–15.4)
WBC: 8.8 10*3/uL (ref 3.4–10.8)

## 2017-02-27 ENCOUNTER — Telehealth: Payer: Self-pay | Admitting: Lab

## 2017-02-27 DIAGNOSIS — R102 Pelvic and perineal pain: Secondary | ICD-10-CM | POA: Insufficient documentation

## 2017-02-27 MED ORDER — IBUPROFEN 600 MG PO TABS: 600.0000 mg | ORAL_TABLET | Freq: Four times a day (QID) | ORAL | 0 refills | Status: DC | PRN

## 2017-02-27 NOTE — Telephone Encounter (Signed)
[facility] called stating that they need a refill for Ibuprofen 600 mg tablet #60.

## 2017-02-27 NOTE — Telephone Encounter (Signed)
Refill approval received from Dr. Alysia Penna and sent to pharmacy

## 2017-02-28 NOTE — Telephone Encounter (Signed)
Error

## 2017-04-13 ENCOUNTER — Emergency Department (HOSPITAL_COMMUNITY): Payer: Medicaid Other

## 2017-04-13 ENCOUNTER — Encounter (HOSPITAL_COMMUNITY): Payer: Self-pay | Admitting: Emergency Medicine

## 2017-04-13 ENCOUNTER — Emergency Department (HOSPITAL_COMMUNITY)
Admission: EM | Admit: 2017-04-13 | Discharge: 2017-04-13 | Disposition: A | Discharge status: Home / Self Care | Payer: Medicaid Other | Attending: Emergency Medicine | Admitting: Emergency Medicine

## 2017-04-13 DIAGNOSIS — F17210 Nicotine dependence, cigarettes, uncomplicated: Secondary | ICD-10-CM | POA: Insufficient documentation

## 2017-04-13 DIAGNOSIS — N83202 Unspecified ovarian cyst, left side: Secondary | ICD-10-CM

## 2017-04-13 DIAGNOSIS — R102 Pelvic and perineal pain: Secondary | ICD-10-CM

## 2017-04-13 DIAGNOSIS — R1030 Lower abdominal pain, unspecified: Secondary | ICD-10-CM | POA: Diagnosis present

## 2017-04-13 DIAGNOSIS — I10 Essential (primary) hypertension: Secondary | ICD-10-CM | POA: Insufficient documentation

## 2017-04-13 DIAGNOSIS — Z79899 Other long term (current) drug therapy: Secondary | ICD-10-CM | POA: Diagnosis not present

## 2017-04-13 DIAGNOSIS — Z91040 Latex allergy status: Secondary | ICD-10-CM | POA: Insufficient documentation

## 2017-04-13 LAB — COMPREHENSIVE METABOLIC PANEL
ALT: 17 U/L (ref 14–54)
ANION GAP: 13 (ref 5–15)
AST: 19 U/L (ref 15–41)
Albumin: 4.4 g/dL (ref 3.5–5.0)
Alkaline Phosphatase: 90 U/L (ref 38–126)
BUN: 11 mg/dL (ref 6–20)
CHLORIDE: 104 mmol/L (ref 101–111)
CO2: 23 mmol/L (ref 22–32)
Calcium: 9.5 mg/dL (ref 8.9–10.3)
Creatinine, Ser: 0.59 mg/dL (ref 0.44–1.00)
GFR calc Af Amer: >60 mL/min (ref >60)
GFR calc non Af Amer: >60 mL/min (ref >60)
GLUCOSE: 136 mg/dL — AB (ref 65–99)
POTASSIUM: 3.2 mmol/L — AB (ref 3.5–5.1)
SODIUM: 140 mmol/L (ref 135–145)
Total Bilirubin: 0.8 mg/dL (ref 0.3–1.2)
Total Protein: 8.7 g/dL — ABNORMAL HIGH (ref 6.5–8.1)

## 2017-04-13 LAB — CBC
HEMATOCRIT: 44.3 % (ref 36.0–46.0)
HEMATOCRIT: 40.6 % (ref 36.0–46.0)
HEMOGLOBIN: 14.5 g/dL (ref 12.0–15.0)
Hemoglobin: 13 g/dL (ref 12.0–15.0)
MCH: 25.6 pg — ABNORMAL LOW (ref 26.0–34.0)
MCH: 25.3 pg — ABNORMAL LOW (ref 26.0–34.0)
MCHC: 32.7 g/dL (ref 30.0–36.0)
MCHC: 32 g/dL (ref 30.0–36.0)
MCV: 78.3 fL (ref 78.0–100.0)
MCV: 79.1 fL (ref 78.0–100.0)
PLATELETS: 352 10*3/uL (ref 150–400)
Platelets: 430 10*3/uL — ABNORMAL HIGH (ref 150–400)
RBC: 5.66 MIL/uL — ABNORMAL HIGH (ref 3.87–5.11)
RBC: 5.13 MIL/uL — ABNORMAL HIGH (ref 3.87–5.11)
RDW: 18.3 % — ABNORMAL HIGH (ref 11.5–15.5)
RDW: 18.2 % — AB (ref 11.5–15.5)
WBC: 16.2 10*3/uL — ABNORMAL HIGH (ref 4.0–10.5)
WBC: 17.8 10*3/uL — ABNORMAL HIGH (ref 4.0–10.5)

## 2017-04-13 LAB — I-STAT BETA HCG BLOOD, ED (MC, WL, AP ONLY)

## 2017-04-13 LAB — URINALYSIS, ROUTINE W REFLEX MICROSCOPIC
BILIRUBIN URINE: NEGATIVE
Glucose, UA: NEGATIVE mg/dL
Hgb urine dipstick: NEGATIVE
KETONES UR: NEGATIVE mg/dL
Leukocytes, UA: NEGATIVE
Nitrite: NEGATIVE
PH: 6 (ref 5.0–8.0)
Protein, ur: NEGATIVE mg/dL

## 2017-04-13 LAB — LIPASE, BLOOD: LIPASE: 22 U/L (ref 11–51)

## 2017-04-13 MED ORDER — IOPAMIDOL (ISOVUE-300) INJECTION 61%
100.0000 mL | Freq: Once | INTRAVENOUS | Status: AC | PRN
  Administered 2017-04-13: 100 mL via INTRAVENOUS

## 2017-04-13 MED ORDER — FENTANYL CITRATE (PF) 100 MCG/2ML IJ SOLN
100.0000 ug | Freq: Once | INTRAMUSCULAR | Status: AC
  Administered 2017-04-13: 100 ug via INTRAVENOUS
  Filled 2017-04-13: qty 2

## 2017-04-13 MED ORDER — KETOROLAC TROMETHAMINE 30 MG/ML IJ SOLN
30.0000 mg | Freq: Once | INTRAMUSCULAR | Status: AC
  Administered 2017-04-13: 30 mg via INTRAVENOUS
  Filled 2017-04-13: qty 1

## 2017-04-13 MED ORDER — HYDROMORPHONE HCL 1 MG/ML IJ SOLN
1.0000 mg | Freq: Once | INTRAMUSCULAR | Status: AC
  Administered 2017-04-13: 1 mg via INTRAVENOUS
  Filled 2017-04-13: qty 1

## 2017-04-13 MED ORDER — IBUPROFEN 600 MG PO TABS: 600.0000 mg | ORAL_TABLET | Freq: Four times a day (QID) | ORAL | 0 refills | Status: AC | PRN

## 2017-04-13 MED ORDER — ONDANSETRON HCL 4 MG/2ML IJ SOLN
4.0000 mg | Freq: Once | INTRAMUSCULAR | Status: AC
  Administered 2017-04-13: 4 mg via INTRAVENOUS
  Filled 2017-04-13: qty 2

## 2017-04-13 MED ORDER — SODIUM CHLORIDE 0.9 % IV BOLUS (SEPSIS)
500.0000 mL | Freq: Once | INTRAVENOUS | Status: AC
  Administered 2017-04-13: 500 mL via INTRAVENOUS

## 2017-04-13 MED ORDER — IOPAMIDOL (ISOVUE-300) INJECTION 61%
INTRAVENOUS | Status: AC
  Administered 2017-04-13: 100 mL via INTRAVENOUS
  Filled 2017-04-13: qty 100

## 2017-04-13 NOTE — ED Notes (Signed)
Pt ambulatory and independent at discharge.  Verbalized understanding of discharge instructions 

## 2017-04-13 NOTE — ED Notes (Signed)
PATIENT UNABLE TO PROVIDE URINE SAMPLE AT THIS TIME. 

## 2017-04-13 NOTE — ED Notes (Signed)
Patient transported to Ultrasound 

## 2017-04-13 NOTE — ED Triage Notes (Signed)
Patient here from home with complaints of generalized abdominal pain x1 month. Reports that she was seen by her regular doctor who prescribed oxycodone, but she stopped taking it. Pain 10/10.

## 2017-04-13 NOTE — ED Notes (Signed)
Bed: WA05 Expected date:  Expected time:  Means of arrival:  Comments: EMS/abd pain 

## 2017-04-13 NOTE — ED Provider Notes (Signed)
[facility]-EMERGENCY DEPT Provider Note   CSN: [id number] Arrival date & time: 04/13/17  0740     History   Chief Complaint Chief Complaint  Patient presents with  . Abdominal Pain    HPI Shelley Ford is a 36 y.o. female.  HPI Patient presents with abdominal pain. Has had for the last month. Sensation treated by her primary care doctor told her it was back pain. Given her oxycodone for. States has been worse the last day. She is tearful on my history. States the pain has been over her lower abdomen. Has had some constipation. Has had some nausea and vomiting. Has had a previous C-section and uterine ablation. Denies vaginal bleeding or discharge. No dysuria. She has had some chills. Pain has been getting worse. Past Medical History:  Diagnosis Date  . Anemia   . Anxiety   . GERD (gastroesophageal reflux disease)   . Heart murmur   . Hypertension   . Ovarian cyst   . Pelvic pain in female   . PONV (postoperative nausea and vomiting)   . Reflux     Patient Active Problem List   Diagnosis Date Noted  . Pelvic pain in female 02/27/2017  . Anemia due to chronic blood loss 08/04/2016  . Abnormal uterine bleeding (AUB) 08/04/2016    Past Surgical History:  Procedure Laterality Date  . BLADDER SURGERY    . CESAREAN SECTION    . DILATATION & CURETTAGE/HYSTEROSCOPY WITH MYOSURE N/A 08/30/2016   Procedure: DILATATION & CURETTAGE/HYSTEROSCOPY WITH MYOSURE;  Surgeon: Willodean Rosenthal, MD;  Location: [facility];  Service: Gynecology;  Laterality: N/A;  . HYSTEROSCOPY WITH NOVASURE N/A 08/30/2016   Procedure: HYSTEROSCOPY WITH NOVASURE;  Surgeon: Willodean Rosenthal, MD;  Location: [facility];  Service: Gynecology;  Laterality: N/A;  . TUBAL LIGATION    . WISDOM TOOTH EXTRACTION      OB History    Gravida Para Term Preterm AB Living   SAB TAB Ectopic Multiple Live Births   1               Home Medications    Prior to Admission medications   Medication Sig Start  Date End Date Taking? Authorizing Provider  amLODipine (NORVASC) 10 MG tablet Take 10 mg by mouth daily. 03/07/17  Yes [provider]  atorvastatin (LIPITOR) 20 MG tablet Take 20 mg by mouth daily.   Yes [provider]  cetirizine (ZYRTEC) 10 MG tablet Take 10 mg by mouth daily. 03/12/17  Yes [provider]  fluticasone (FLONASE) 50 MCG/ACT nasal spray Place 1 spray into both nostrils daily as needed. For allergies/congestion. 06/28/16  Yes [provider]  hydrochlorothiazide (HYDRODIURIL) 25 MG tablet Take 25 mg by mouth daily.   Yes [provider]  metoprolol succinate (TOPROL-XL) 50 MG 24 hr tablet Take 50 mg by mouth daily. Take with or immediately following a meal.   Yes [provider]  montelukast (SINGULAIR) 10 MG tablet Take 10 mg by mouth daily. 03/07/17  Yes [provider]  omeprazole (PRILOSEC OTC) 20 MG tablet Take 20 mg by mouth daily as needed (for heartburn/indigestion.).    Yes [provider]  sertraline (ZOLOFT) 100 MG tablet Take 100 mg by mouth daily.    Yes [provider]  ibuprofen (ADVIL,MOTRIN) 600 MG tablet Take 1 tablet (600 mg total) by mouth every 6 (six) hours as needed. 04/13/17   Benjiman Core, MD    Family  History Family History  Problem Relation Age of Onset  . Hypertension Mother   . Hyperlipidemia Mother   . Asthma Mother   . Heart disease Mother   . Depression Mother   . Hypertension Father   . Diabetes Father     Social History Social History  Substance Use Topics  . Smoking status: Current Every Day Smoker    Packs/day: 0.25    Years: 7.00    Types: Cigarettes  . Smokeless tobacco: Never Used     Comment: Vapor  . Alcohol use No     Allergies   Dimetapp decongestant [pseudoephedrine] and Latex   Review of Systems Review of Systems  Constitutional: Positive for appetite change. Negative for fever.  HENT: Negative for congestion.   Respiratory:  Negative for shortness of breath.   Cardiovascular: Negative for chest pain.  Gastrointestinal: Positive for abdominal pain, constipation, nausea and vomiting.  Genitourinary: Negative for flank pain, vaginal bleeding and vaginal discharge.  Musculoskeletal: Negative for back pain.  Skin: Negative for wound.  Psychiatric/Behavioral: Negative for confusion.     Physical Exam Updated Vital Signs BP (!) 146/64 (BP Location: Left Arm)   Pulse 92   Temp 98.9 F (37.2 C) (Oral)   Resp 18   LMP 12/12/2016 Comment: negative HCG blood test 04-13-2017  SpO2 98%   Physical Exam  Constitutional: She appears well-developed.  HENT:  Head: Normocephalic.  Eyes: Pupils are equal, round, and reactive to light.  Cardiovascular: Normal rate.   Pulmonary/Chest: Effort normal.  Abdominal: Soft. There is tenderness.  Diffuse tenderness, worse in the lower abdomen. Patient is tearful with any palpation.  Musculoskeletal: She exhibits no edema.  Neurological: She is alert.  Skin: Capillary refill takes less than 2 seconds.     ED Treatments / Results  Labs (all labs ordered are listed, but only abnormal results are displayed) Labs Reviewed  COMPREHENSIVE METABOLIC PANEL - Abnormal; Notable for the following:       Result Value   Potassium 3.2 (*)    Glucose, Bld 136 (*)    Total Protein 8.7 (*)    All other components within normal limits  CBC - Abnormal; Notable for the following:    WBC 16.2 (*)    RBC 5.66 (*)    MCH 25.6 (*)    RDW 18.3 (*)    Platelets 430 (*)    All other components within normal limits  URINALYSIS, ROUTINE W REFLEX MICROSCOPIC - Abnormal; Notable for the following:    Specific Gravity, Urine >1.046 (*)    Bacteria, UA RARE (*)    Squamous Epithelial / LPF 0-5 (*)    All other components within normal limits  CBC - Abnormal; Notable for the following:    WBC 17.8 (*)    RBC 5.13 (*)    MCH 25.3 (*)    RDW 18.2 (*)    All other components within normal  limits  LIPASE, BLOOD  I-STAT BETA HCG BLOOD, ED ([facility], [facility], [facility] ONLY)    EKG  EKG Interpretation None       Radiology US Transvaginal Non-ob  Result Date: 04/13/2017 CLINICAL DATA:  Acute pelvic pain. EXAM: TRANSABDOMINAL AND TRANSVAGINAL ULTRASOUND OF PELVIS DOPPLER ULTRASOUND OF OVARIES TECHNIQUE: Both transabdominal and transvaginal ultrasound examinations of the pelvis were performed. Transabdominal technique was performed for global imaging of the pelvis including uterus, ovaries, adnexal regions, and pelvic cul-de-sac. It was necessary to proceed with endovaginal exam following the transabdominal exam to visualize  the ovary. Color and duplex Doppler ultrasound was utilized to evaluate blood flow to the ovaries. COMPARISON:  CT scan of same day.  Ultrasound of July 28, 2016. FINDINGS: Uterus Measurements: 11.1 x 10.6 x 7.6 cm. No fibroids or other mass visualized. Endometrium Thickness: 35 mm which is abnormally thickened. No focal abnormality visualized. Right ovary Not visualized. Left ovary Measurements: 2.8 x 2.0 x 1.6 cm. 2.3 x 2.0 x 1.3 cm Multi-septated cystic abnormality is seen in the left adnexal region which is not significantly changed compared to prior exam. Pulsed Doppler evaluation of left ovary demonstrates normal low-resistance arterial and venous waveforms. Other findings Small amount of free fluid is noted. IMPRESSION: Severely thickened endometrium is noted. No definite focal abnormality is noted. Endometrial thickness is considered abnormal. Consider follow-up by [place] in 6-8 weeks, during the week immediately following menses (exam timing is critical). Right ovary is not visualized. Stable 2.3 cm multi-septated cystic abnormality seen in left adnexal region. Electronically Signed   By: Lupita Raider, M.D.   On: 04/13/2017 12:55   US Pelvis Complete  Result Date: 04/13/2017 CLINICAL DATA:  Acute pelvic pain. EXAM: TRANSABDOMINAL AND TRANSVAGINAL ULTRASOUND OF PELVIS  DOPPLER ULTRASOUND OF OVARIES TECHNIQUE: Both transabdominal and transvaginal ultrasound examinations of the pelvis were performed. Transabdominal technique was performed for global imaging of the pelvis including uterus, ovaries, adnexal regions, and pelvic cul-de-sac. It was necessary to proceed with endovaginal exam following the transabdominal exam to visualize the ovary. Color and duplex Doppler ultrasound was utilized to evaluate blood flow to the ovaries. COMPARISON:  CT scan of same day.  Ultrasound of July 28, 2016. FINDINGS: Uterus Measurements: 11.1 x 10.6 x 7.6 cm. No fibroids or other mass visualized. Endometrium Thickness: 35 mm which is abnormally thickened. No focal abnormality visualized. Right ovary Not visualized. Left ovary Measurements: 2.8 x 2.0 x 1.6 cm. 2.3 x 2.0 x 1.3 cm Multi-septated cystic abnormality is seen in the left adnexal region which is not significantly changed compared to prior exam. Pulsed Doppler evaluation of left ovary demonstrates normal low-resistance arterial and venous waveforms. Other findings Small amount of free fluid is noted. IMPRESSION: Severely thickened endometrium is noted. No definite focal abnormality is noted. Endometrial thickness is considered abnormal. Consider follow-up by [place] in 6-8 weeks, during the week immediately following menses (exam timing is critical). Right ovary is not visualized. Stable 2.3 cm multi-septated cystic abnormality seen in left adnexal region. Electronically Signed   By: Lupita Raider, M.D.   On: 04/13/2017 12:55   Ct Abdomen Pelvis W Contrast  Result Date: 04/13/2017 CLINICAL DATA:  Right-sided abdominal pain for 1 month. EXAM: CT ABDOMEN AND PELVIS WITH CONTRAST TECHNIQUE: Multidetector CT imaging of the abdomen and pelvis was performed using the standard protocol following bolus administration of intravenous contrast. CONTRAST:  100 cc Isovue-300 intravenous contrast. COMPARISON:  Pelvic ultrasound dated July 28, 2016. FINDINGS: Lower chest: No acute abnormality. 4 mm pulmonary nodule in the right middle lobe (series 4, image 9). Hepatobiliary: No focal liver abnormality is seen. No gallstones, gallbladder wall thickening, or biliary dilatation. Pancreas: Unremarkable. No pancreatic ductal dilatation or surrounding inflammatory changes. Spleen: Normal in size without focal abnormality. Adrenals/Urinary Tract: Adrenal glands are unremarkable. Kidneys are normal, without renal calculi, focal lesion, or hydronephrosis. Bladder is unremarkable. Stomach/Bowel: Stomach is within normal limits. Appendix appears normal. No evidence of bowel wall thickening, distention, or inflammatory changes. Vascular/Lymphatic: No significant vascular findings are present. No enlarged abdominal or pelvic lymph nodes.  Reproductive: Enlarged, globular heterogeneous uterus with apparent thickening of the ablated endometrial cavity. There is a 4 cm cystic lesion in the right ovary with Hounsfield units measuring 22. Other: Small volume free fluid in the pelvis and around the liver and spleen, slightly denser than simple fluid. No pneumoperitoneum. Musculoskeletal: No acute or significant osseous findings. IMPRESSION: 1. 4 mm cystic lesion in the right ovary with small volume free fluid in the pelvis and around the liver and spleen, slightly denser than simple fluid. Findings are suspicious for ruptured hemorrhagic ovarian cyst. Consider further evaluation with pelvic ultrasound. 2. Enlarged, globular heterogeneous uterus with apparent thickening of the ablated endometrial cavity, nonspecific findings that could represent a combination of uterine adenomyosis and post ablation change. Consider further evaluation with pelvic ultrasound as clinically warranted. Electronically Signed   By: Obie Dredge M.D.   On: 04/13/2017 10:11   [place] Art/ven Flow Abd Pelv Doppler  Result Date: 04/13/2017 CLINICAL DATA:  Acute pelvic pain. EXAM: TRANSABDOMINAL AND  TRANSVAGINAL ULTRASOUND OF PELVIS DOPPLER ULTRASOUND OF OVARIES TECHNIQUE: Both transabdominal and transvaginal ultrasound examinations of the pelvis were performed. Transabdominal technique was performed for global imaging of the pelvis including uterus, ovaries, adnexal regions, and pelvic cul-de-sac. It was necessary to proceed with endovaginal exam following the transabdominal exam to visualize the ovary. Color and duplex Doppler ultrasound was utilized to evaluate blood flow to the ovaries. COMPARISON:  CT scan of same day.  Ultrasound of July 28, 2016. FINDINGS: Uterus Measurements: 11.1 x 10.6 x 7.6 cm. No fibroids or other mass visualized. Endometrium Thickness: 35 mm which is abnormally thickened. No focal abnormality visualized. Right ovary Not visualized. Left ovary Measurements: 2.8 x 2.0 x 1.6 cm. 2.3 x 2.0 x 1.3 cm Multi-septated cystic abnormality is seen in the left adnexal region which is not significantly changed compared to prior exam. Pulsed Doppler evaluation of left ovary demonstrates normal low-resistance arterial and venous waveforms. Other findings Small amount of free fluid is noted. IMPRESSION: Severely thickened endometrium is noted. No definite focal abnormality is noted. Endometrial thickness is considered abnormal. Consider follow-up by [place] in 6-8 weeks, during the week immediately following menses (exam timing is critical). Right ovary is not visualized. Stable 2.3 cm multi-septated cystic abnormality seen in left adnexal region. Electronically Signed   By: Lupita Raider, M.D.   On: 04/13/2017 12:55    Procedures Procedures (including critical care time)  Medications Ordered in ED Medications  sodium chloride 0.9 % bolus 500 mL (0 mLs Intravenous Stopped 04/13/17 1028)  fentaNYL (SUBLIMAZE) injection 100 mcg (100 mcg Intravenous Given 04/13/17 0827)  ondansetron (ZOFRAN) injection 4 mg (4 mg Intravenous Given 04/13/17 0827)  iopamidol (ISOVUE-300) 61 % injection 100 mL  (100 mLs Intravenous Contrast Given 04/13/17 0922)  HYDROmorphone (DILAUDID) injection 1 mg (1 mg Intravenous Given 04/13/17 0911)  HYDROmorphone (DILAUDID) injection 1 mg (1 mg Intravenous Given 04/13/17 1200)  ketorolac (TORADOL) 30 MG/ML injection 30 mg (30 mg Intravenous Given 04/13/17 1317)     Initial Impression / Assessment and Plan / ED Course  I have reviewed the triage vital signs and the nursing notes.  Pertinent labs & imaging results that were available during my care of the patient were reviewed by me and considered in my medical decision making (see chart for details).     Patient with lower abdominal pelvic pain. Unrelieved locks code on. White count is elevated. However hemoglobin is reassuring. Ultrasound showed possible hemorrhagic ovarian cyst as did the CT scan.  Uterine endometrial thickness is somewhat elevated. This could be normal post ablation or could be somewhat abnormal. Patient is unsure what her menses would be. Discussed with Dr. Julien Girt from GYN. Recommended NSAIDs. Recommend repeat hemoglobin since there is no free fluid. Hemoglobin stable. Patient has follow-up with her gynecologist on Monday with today being Thursday. Will discharge with Motrin and patient for follow-up.  Final Clinical Impressions(s) / ED Diagnoses   Final diagnoses:  Cyst of left ovary    New Prescriptions Discharge Medication List as of 04/13/2017  2:54 PM       Benjiman Core, MD 04/13/17 1623

## 2017-04-17 ENCOUNTER — Ambulatory Visit (INDEPENDENT_AMBULATORY_CARE_PROVIDER_SITE_OTHER): Payer: Medicaid Other | Admitting: Obstetrics & Gynecology

## 2017-04-17 ENCOUNTER — Encounter (HOSPITAL_COMMUNITY): Payer: Self-pay

## 2017-04-17 ENCOUNTER — Encounter: Payer: Self-pay | Admitting: Obstetrics & Gynecology

## 2017-04-17 ENCOUNTER — Other Ambulatory Visit (HOSPITAL_COMMUNITY)
Admission: RE | Admit: 2017-04-17 | Discharge: 2017-04-17 | Disposition: A | Discharge status: Home / Self Care | Payer: Medicaid Other | Source: Ambulatory Visit | Attending: Obstetrics & Gynecology | Admitting: Obstetrics & Gynecology

## 2017-04-17 ENCOUNTER — Ambulatory Visit: Payer: Self-pay | Admitting: Obstetrics & Gynecology

## 2017-04-17 VITALS — BP 179/81 | HR 73 | Wt 211.3 lb

## 2017-04-17 DIAGNOSIS — R9389 Abnormal findings on diagnostic imaging of other specified body structures: Secondary | ICD-10-CM | POA: Insufficient documentation

## 2017-04-17 NOTE — Progress Notes (Signed)
Patient ID: Shelley Ford, female   DOB: 11/27/1980, 36 y.o.   MRN: [id number]  No chief complaint on file.   HPI Shelley Ford is a 36 y.o. female.  She is here because of a 2 month h/o pelvic pain. This started 6 monhts after she had a Novasure for DUB. She doesn't have periods any longer. She has pain after sex, this has been going on for about a year. HPI  Past Medical History:  Diagnosis Date  . Anemia   . Anxiety   . GERD (gastroesophageal reflux disease)   . Heart murmur   . Hypertension   . Ovarian cyst   . Pelvic pain in female   . PONV (postoperative nausea and vomiting)   . Reflux     Past Surgical History:  Procedure Laterality Date  . BLADDER SURGERY    . CESAREAN SECTION    . DILATATION & CURETTAGE/HYSTEROSCOPY WITH MYOSURE N/A 08/30/2016   Procedure: DILATATION & CURETTAGE/HYSTEROSCOPY WITH MYOSURE;  Surgeon: Willodean Rosenthal, MD;  Location: [facility];  Service: Gynecology;  Laterality: N/A;  . HYSTEROSCOPY WITH NOVASURE N/A 08/30/2016   Procedure: HYSTEROSCOPY WITH NOVASURE;  Surgeon: Willodean Rosenthal, MD;  Location: [facility];  Service: Gynecology;  Laterality: N/A;  . TUBAL LIGATION    . WISDOM TOOTH EXTRACTION      Family History  Problem Relation Age of Onset  . Hypertension Mother   . Hyperlipidemia Mother   . Asthma Mother   . Heart disease Mother   . Depression Mother   . Hypertension Father   . Diabetes Father     Social History Social History  Substance Use Topics  . Smoking status: Current Some Day Smoker    Packs/day: 0.25    Years: 7.00    Types: Cigarettes  . Smokeless tobacco: Never Used     Comment: Vapor  . Alcohol use No    Allergies  Allergen Reactions  . Dimetapp Decongestant [Pseudoephedrine]     Unknown. Child hood allergy.   . Latex Itching and Rash    Current Outpatient Prescriptions  Medication Sig Dispense Refill  . amLODipine (NORVASC) 10 MG tablet Take 10 mg by mouth daily.  2  . atorvastatin (LIPITOR) 20 MG  tablet Take 20 mg by mouth daily.    . cetirizine (ZYRTEC) 10 MG tablet Take 10 mg by mouth daily.  2  . fluticasone (FLONASE) 50 MCG/ACT nasal spray Place 1 spray into both nostrils daily as needed. For allergies/congestion.  2  . hydrochlorothiazide (HYDRODIURIL) 25 MG tablet Take 25 mg by mouth daily.    Marland Kitchen ibuprofen (ADVIL,MOTRIN) 600 MG tablet Take 1 tablet (600 mg total) by mouth every 6 (six) hours as needed. 10 tablet 0  . metoprolol succinate (TOPROL-XL) 50 MG 24 hr tablet Take 50 mg by mouth daily. Take with or immediately following a meal.    . montelukast (SINGULAIR) 10 MG tablet Take 10 mg by mouth daily.  2  . omeprazole (PRILOSEC OTC) 20 MG tablet Take 20 mg by mouth daily as needed (for heartburn/indigestion.).     Marland Kitchen sertraline (ZOLOFT) 100 MG tablet Take 100 mg by mouth daily.      No current facility-administered medications for this visit.     Review of Systems Review of Systems  She has alternating diarrhea and constipation. She works at [employer] but hasn't been there full time for 2 months because of this pain. She takes care of her 37 yo daughter who has hydrocephalus due  to shaken baby syndrome.   Blood pressure (!) 179/81, pulse 73, weight 211 lb 4.8 oz (95.8 kg), last menstrual period 11/15/2016.  Physical Exam Physical Exam Moderately obese pleasant White female, no apparent distress Breathing, conversing, and ambulating normally  UPT negative, consent signed, time out done Cervix prepped with betadine and grasped with a single tooth tenaculum Uterus sounded to 9 cm Pipelle used for 2 passes with a moderate amount of tissue and lots of old blood obtained. She tolerated the procedure well.   Data Reviewed U/S: Severely thickened endometrium is noted. No definite focal abnormality is noted. Endometrial thickness is considered abnormal. Consider follow-up by [place] in 6-8 weeks, during the week immediately following menses (exam timing is critical).  Right  ovary is not visualized.  Stable 2.3 cm multi-septated cystic abnormality seen in left adnexal region.   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 She is certain that she wants to proceed with this.  She understands the risks of surgery, including, but not to infection, bleeding, DVTs, damage to bowel, bladder, ureters. She wishes to proceed.  I sent Rhea Bleacher an email to schedule this.    Plan           Maico Mulvehill C Aleaya Latona 04/17/2017, 11:19 AM

## 2017-06-13 ENCOUNTER — Encounter (HOSPITAL_COMMUNITY): Payer: Self-pay

## 2017-06-13 ENCOUNTER — Inpatient Hospital Stay (HOSPITAL_COMMUNITY): Admit: 2017-06-13 | Payer: Medicaid Other | Admitting: Obstetrics & Gynecology

## 2017-06-13 SURGERY — HYSTERECTOMY, TOTAL, ABDOMINAL, WITH SALPINGECTOMY
Anesthesia: Choice | Laterality: Bilateral

## 2018-11-09 ENCOUNTER — Encounter: Payer: Self-pay | Admitting: *Deleted

## 2019-04-28 IMAGING — CT CT ABD-PELV W/ CM
2 of 4 series · 16 of 46 positions shown, 18 images · IV contrast (ISOVUE)
Comparison: Pelvic ultrasound dated July 28, 2016.

CLINICAL DATA: Right-sided abdominal pain for 1 month.

EXAM:
CT ABDOMEN AND PELVIS WITH CONTRAST
TECHNIQUE: Multidetector CT imaging of the abdomen and pelvis was performed
using the standard protocol following bolus administration of
intravenous contrast.
CONTRAST:  100 cc 5sovue-T88 intravenous contrast.

[Series 2: abd/pel with · axial · 0.74mm/px · z∈[-495,-75]mm · 13 of 97 slices shown, 15 images]
[im 7/97  soft-tissue]
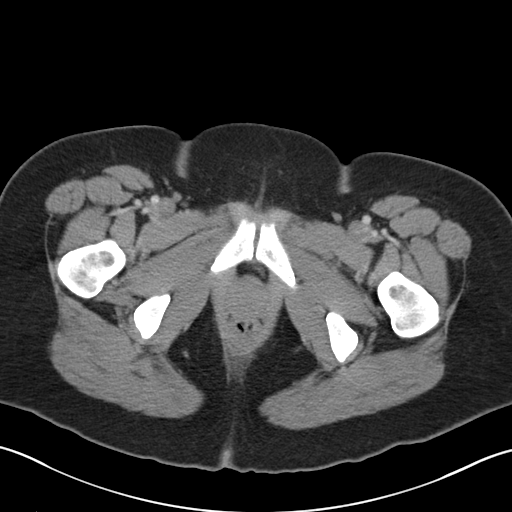
[im 7/97  bone]
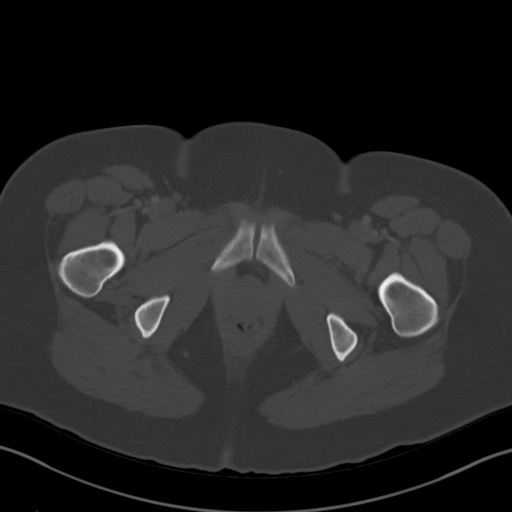
[im 13/97  soft-tissue]
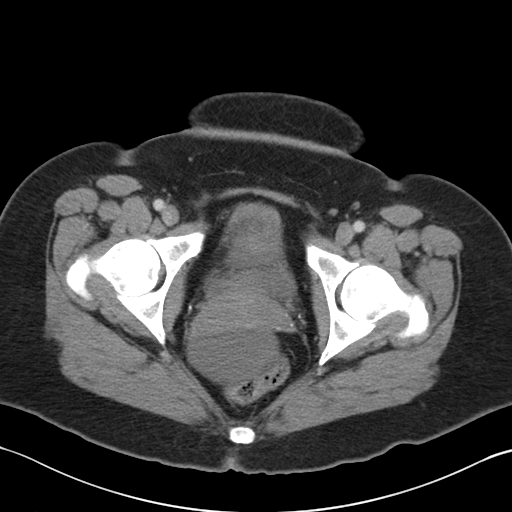
[im 19/97  soft-tissue]
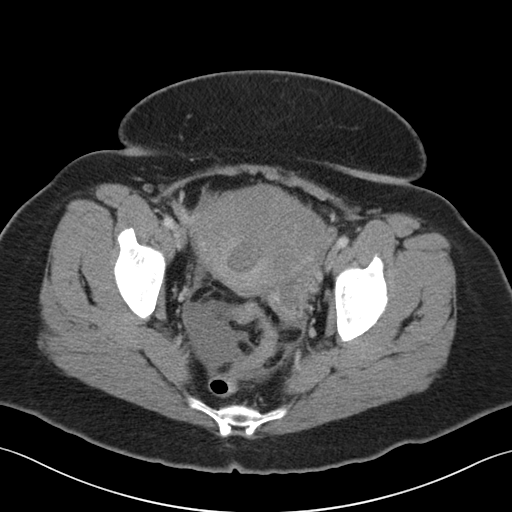
[im 31/97  soft-tissue]
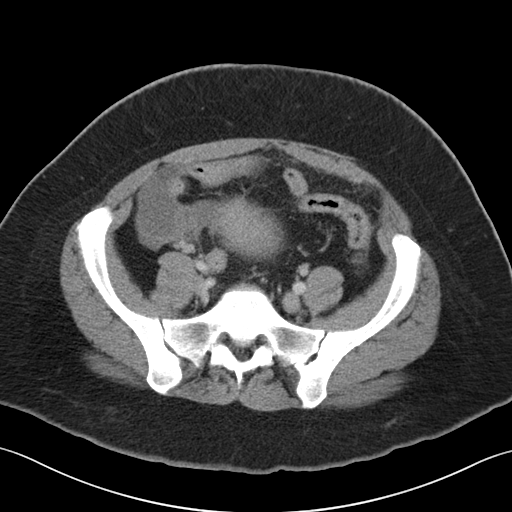
[im 37/97  soft-tissue]
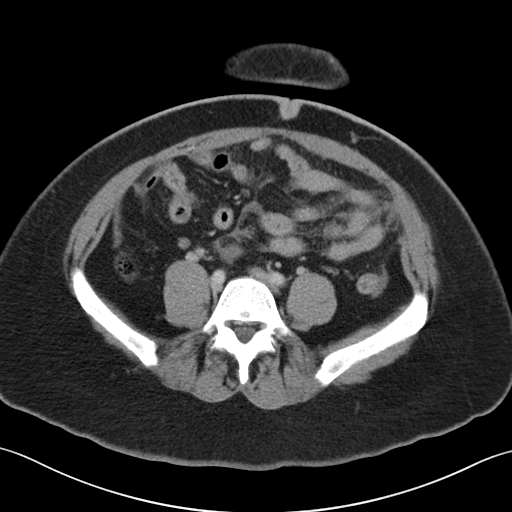
[im 43/97  soft-tissue]
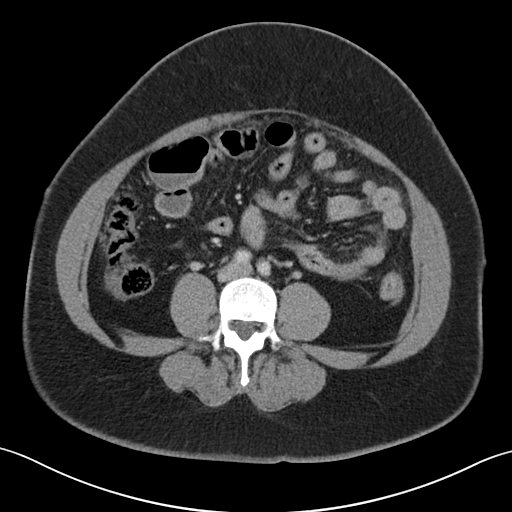
[im 49/97  soft-tissue]
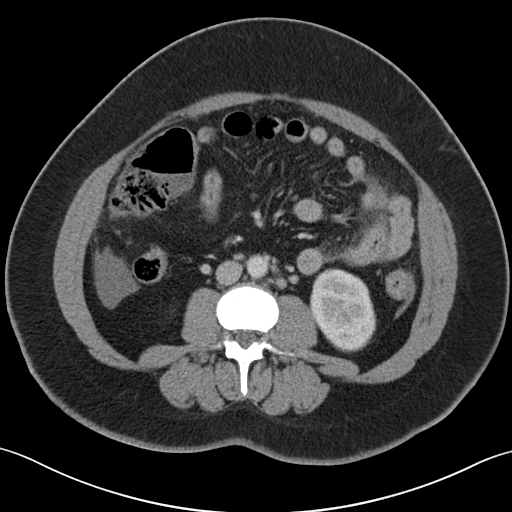
[im 55/97  soft-tissue]
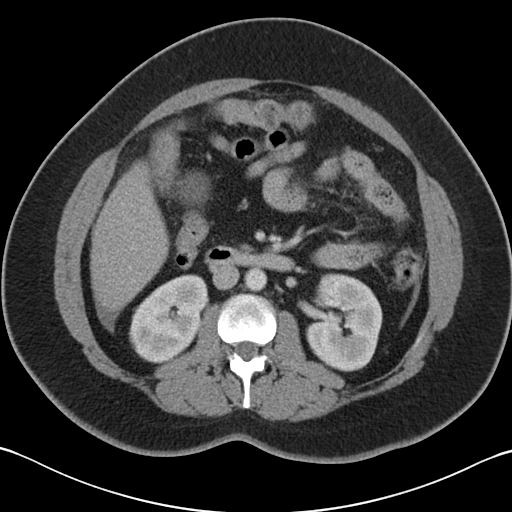
[im 61/97  soft-tissue]
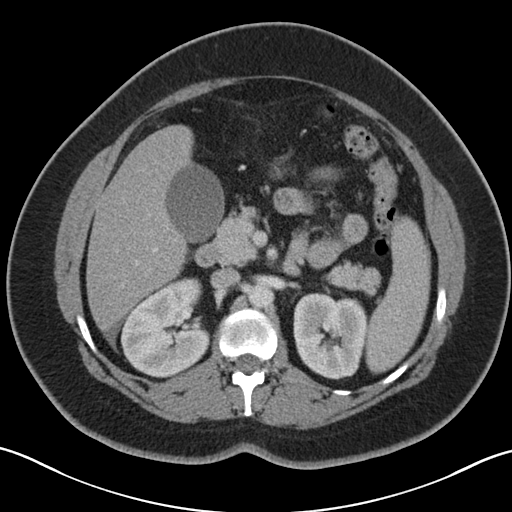
[im 61/97  bone]
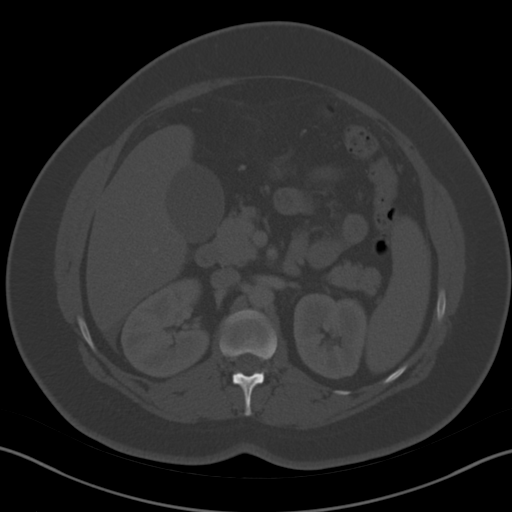
[im 67/97  soft-tissue]
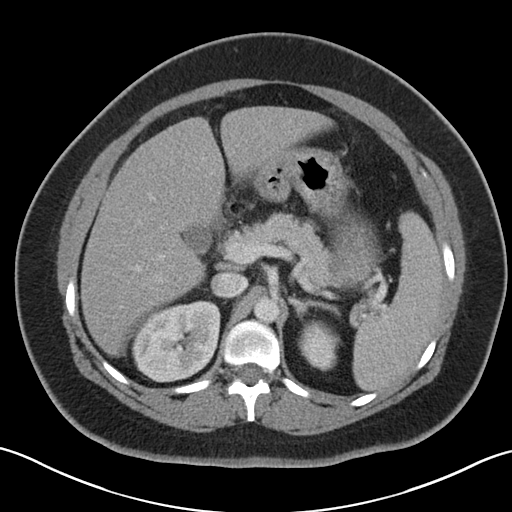
[im 79/97  soft-tissue]
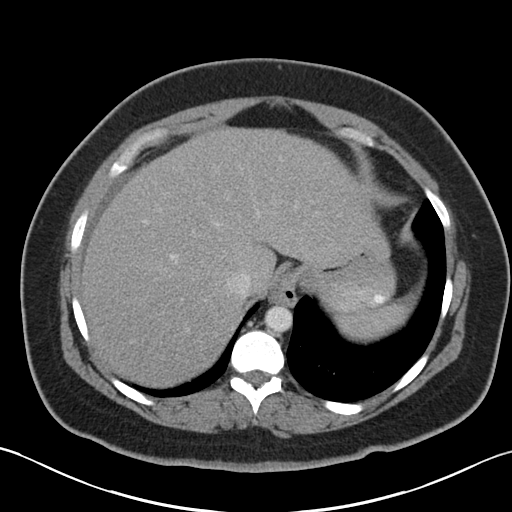
[im 85/97  soft-tissue]
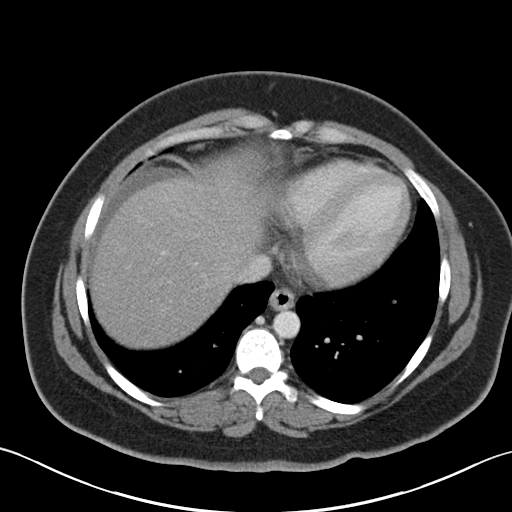
[im 91/97  soft-tissue]
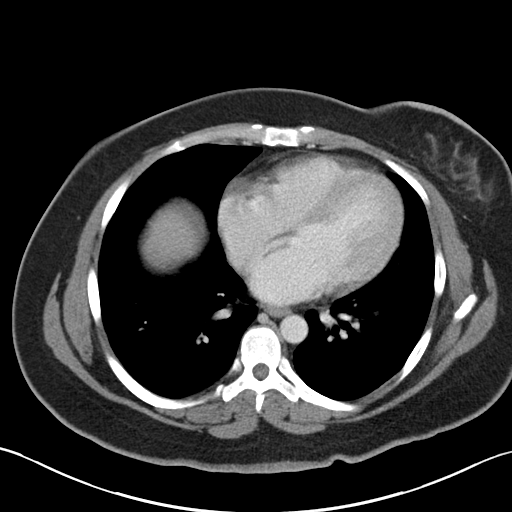

[Series 5: coronal a/|p · coronal · 0.81mm/px · 3 of 176 slices shown]
[im 59/176  soft-tissue]
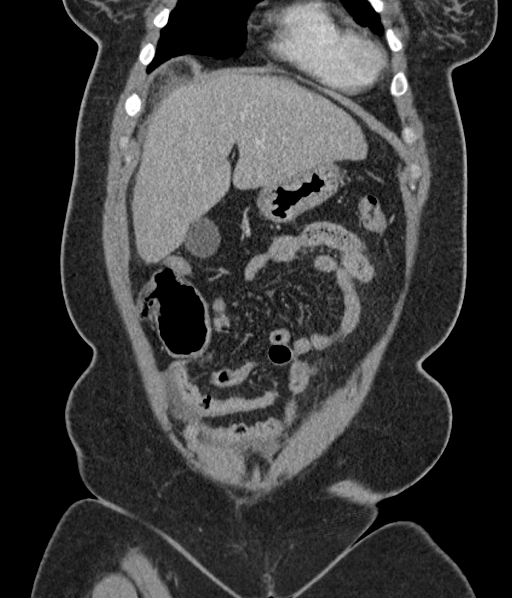
[im 78/176  soft-tissue]
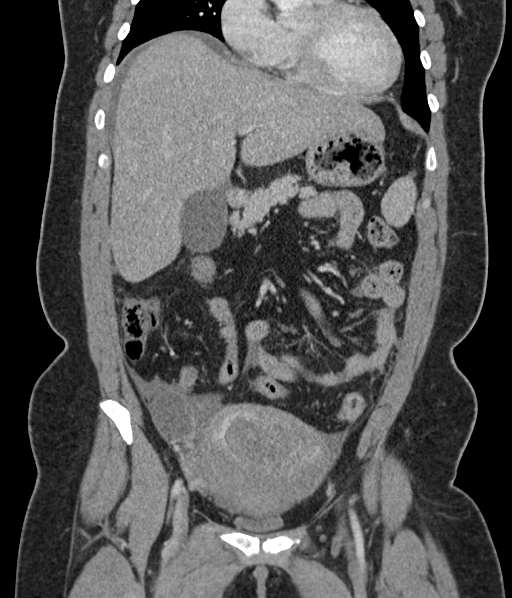
[im 98/176  soft-tissue]
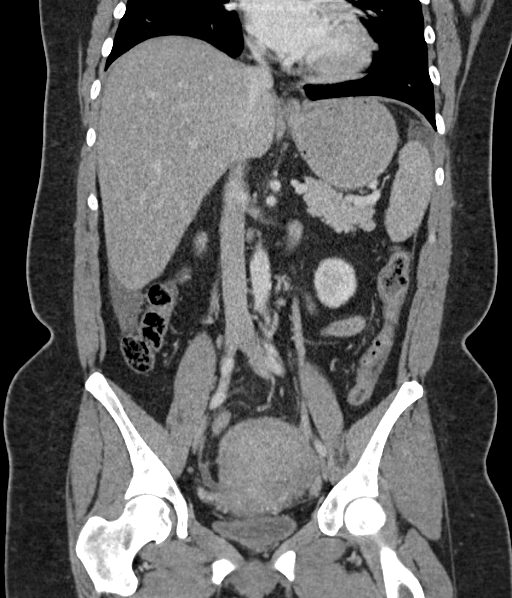

[16 of 46 positions shown; findings below may reference images not displayed]

FINDINGS: Lower chest: No acute abnormality. 4 mm pulmonary nodule in the
right middle lobe (series 4, image 9).

Hepatobiliary: No focal liver abnormality is seen. No gallstones,
gallbladder wall thickening, or biliary dilatation.

Pancreas: Unremarkable. No pancreatic ductal dilatation or
surrounding inflammatory changes.

Spleen: Normal in size without focal abnormality.

Adrenals/Urinary Tract: Adrenal glands are unremarkable. Kidneys are
normal, without renal calculi, focal lesion, or hydronephrosis.
Bladder is unremarkable.

Stomach/Bowel: Stomach is within normal limits. Appendix appears
normal. No evidence of bowel wall thickening, distention, or
inflammatory changes.

Vascular/Lymphatic: No significant vascular findings are present. No
enlarged abdominal or pelvic lymph nodes.

Reproductive: Enlarged, globular heterogeneous uterus with apparent
thickening of the ablated endometrial cavity. There is a 4 cm cystic
lesion in the right ovary with Hounsfield units measuring 22.

Other: Small volume free fluid in the pelvis and around the liver
and spleen, slightly denser than simple fluid. No pneumoperitoneum.

Musculoskeletal: No acute or significant osseous findings.
IMPRESSION: 1. 4 mm cystic lesion in the right ovary with small volume free
fluid in the pelvis and around the liver and spleen, slightly denser
than simple fluid. Findings are suspicious for ruptured hemorrhagic
ovarian cyst. Consider further evaluation with pelvic ultrasound.
2. Enlarged, globular heterogeneous uterus with apparent thickening
of the ablated endometrial cavity, nonspecific findings that could
represent a combination of uterine adenomyosis and post ablation
change. Consider further evaluation with pelvic ultrasound as
clinically warranted.

## 2020-03-03 ENCOUNTER — Other Ambulatory Visit: Payer: Self-pay

## 2020-03-03 ENCOUNTER — Other Ambulatory Visit: Payer: Self-pay | Admitting: Family Medicine

## 2020-03-03 DIAGNOSIS — Z1231 Encounter for screening mammogram for malignant neoplasm of breast: Secondary | ICD-10-CM
